# Patient Record
Sex: Female | Born: 1970 | Race: White | Hispanic: No | Marital: Single | Smoking: Never smoker
Health system: Southern US, Community
[De-identification: ages and names within clinical notes are randomized; demographics above are authoritative.]

## PROBLEM LIST (undated history)

## (undated) DIAGNOSIS — Z8041 Family history of malignant neoplasm of ovary: Secondary | ICD-10-CM

## (undated) DIAGNOSIS — F32A Depression, unspecified: Secondary | ICD-10-CM

## (undated) DIAGNOSIS — I10 Essential (primary) hypertension: Secondary | ICD-10-CM

## (undated) DIAGNOSIS — F419 Anxiety disorder, unspecified: Secondary | ICD-10-CM

## (undated) DIAGNOSIS — F329 Major depressive disorder, single episode, unspecified: Secondary | ICD-10-CM

## (undated) DIAGNOSIS — K219 Gastro-esophageal reflux disease without esophagitis: Secondary | ICD-10-CM

## (undated) DIAGNOSIS — G4700 Insomnia, unspecified: Secondary | ICD-10-CM

## (undated) HISTORY — DX: Gastro-esophageal reflux disease without esophagitis: K21.9

## (undated) HISTORY — DX: Essential (primary) hypertension: I10

## (undated) HISTORY — DX: Depression, unspecified: F32.A

## (undated) HISTORY — DX: Major depressive disorder, single episode, unspecified: F32.9

## (undated) HISTORY — DX: Anxiety disorder, unspecified: F41.9

## (undated) HISTORY — DX: Family history of malignant neoplasm of ovary: Z80.41

## (undated) HISTORY — DX: Insomnia, unspecified: G47.00

## (undated) HISTORY — PX: MEDIAL PARTIAL KNEE REPLACEMENT: SHX5965

---

## 2009-05-15 HISTORY — PX: KNEE ARTHROSCOPY: SHX127

## 2009-11-15 HISTORY — PX: MEDIAL PARTIAL KNEE REPLACEMENT: SHX5965

## 2015-05-16 HISTORY — PX: ABLATION: SHX5711

## 2015-08-16 ENCOUNTER — Encounter: Payer: Self-pay | Admitting: Family Medicine

## 2015-08-16 ENCOUNTER — Ambulatory Visit (INDEPENDENT_AMBULATORY_CARE_PROVIDER_SITE_OTHER): Payer: BLUE CROSS/BLUE SHIELD | Admitting: Family Medicine

## 2015-08-16 VITALS — BP 120/80 | HR 80 | Ht 68.0 in | Wt 299.0 lb

## 2015-08-16 DIAGNOSIS — I10 Essential (primary) hypertension: Secondary | ICD-10-CM

## 2015-08-16 DIAGNOSIS — F419 Anxiety disorder, unspecified: Secondary | ICD-10-CM | POA: Diagnosis not present

## 2015-08-16 DIAGNOSIS — Z7189 Other specified counseling: Secondary | ICD-10-CM

## 2015-08-16 DIAGNOSIS — Z9889 Other specified postprocedural states: Secondary | ICD-10-CM | POA: Diagnosis not present

## 2015-08-16 DIAGNOSIS — Z7689 Persons encountering health services in other specified circumstances: Secondary | ICD-10-CM

## 2015-08-16 MED ORDER — METOPROLOL SUCCINATE ER 100 MG PO TB24: 100.0000 mg | ORAL_TABLET | Freq: Every day | ORAL | 1 refills | Status: DC

## 2015-08-16 MED ORDER — NAPROXEN 500 MG PO TABS: 500.0000 mg | ORAL_TABLET | Freq: Two times a day (BID) | ORAL | 1 refills | Status: DC

## 2015-08-16 NOTE — Progress Notes (Signed)
Name: Laura Christian   MRN: [id number]    DOB: Sep 19, 1970   Date:08/16/2015       Progress Note  Subjective  Chief Complaint  Chief Complaint  Patient presents with  . Establish Care  . 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 Anxiety    has been taking Lorazepam for approx 3 years- has family stress and anxiety- tried to come off in the past and crying spells and anxiety got worse    Knee Pain   The incident occurred more than 1 week ago. The injury mechanism was a fall (stress fx and meniscal tear with partial knee replacement). The pain is present in the left knee. The pain is at a severity of 7/10. The pain is severe. Associated symptoms include an inability to bear weight and a loss of motion. Pertinent negatives include no tingling. The symptoms are aggravated by palpation. She has tried acetaminophen for the symptoms. The treatment provided mild relief.  Anxiety  Presents for follow-up visit. Symptoms include nervous/anxious behavior and panic. Patient reports no chest pain, depressed mood, dizziness, excessive worry, insomnia, irritability, nausea, palpitations, shortness of breath or suicidal ideas. The quality of sleep is good.      No problem-specific Assessment & Plan notes found for this encounter.   No past medical history on file.  No past surgical history on file.  No family history on file.  Social History   Social History  . Marital status: Single    Spouse name: N/A  . Number of children: N/A  . Years of education: N/A   Occupational History  . Not on file.   Social History Main Topics  . Smoking status: Never Smoker  . Smokeless tobacco: Never Used  . Alcohol use Yes  . Drug use: No  . Sexual activity: Yes   Other Topics Concern  . Not on file   Social History Narrative  . No narrative on file    Allergies  Allergen Reactions  . Depakote [Valproic Acid] Nausea  And Vomiting     Review of Systems  Constitutional: Negative for chills, fever, irritability, malaise/fatigue and weight loss.  HENT: Negative for ear discharge, ear pain and sore throat.   Eyes: Negative for blurred vision.  Respiratory: Negative for cough, sputum production, shortness of breath and wheezing.   Cardiovascular: Negative for chest pain, palpitations and leg swelling.  Gastrointestinal: Negative for abdominal pain, blood in stool, constipation, diarrhea, heartburn, melena and nausea.  Genitourinary: Negative for dysuria, frequency, hematuria and urgency.  Musculoskeletal: Negative for back pain, joint pain, myalgias and neck pain.  Skin: Negative for rash.  Neurological: Negative for dizziness, tingling, sensory change, focal weakness and headaches.  Endo/Heme/Allergies: Negative for environmental allergies and polydipsia. Does not bruise/bleed easily.  Psychiatric/Behavioral: Negative for depression and suicidal ideas. The patient is nervous/anxious. The patient does not have insomnia.      Objective  Vitals:   08/16/15 1512  BP: 120/80  Pulse: 80  Weight: 299 lb (135.6 kg)  Height:  (1.727 m)    Physical Exam  Constitutional: She is well-developed, well-nourished, and in no distress. No distress.  HENT:  Head: Normocephalic and atraumatic.  Right Ear: External ear normal.  Left Ear: External ear normal.  Nose: Nose normal.  Mouth/Throat: Oropharynx is clear and moist.  Eyes: Conjunctivae and EOM are normal. Pupils are equal, round, and reactive to light.  Right eye exhibits no discharge. Left eye exhibits no discharge.  Neck: Normal range of motion. Neck supple. No JVD present. No thyromegaly present.  Cardiovascular: Normal rate, regular rhythm, normal heart sounds and intact distal pulses.  Exam reveals no gallop and no friction rub.   No murmur heard. Pulmonary/Chest: Effort normal and breath sounds normal.  Abdominal: Soft. Bowel sounds are normal.  She exhibits no mass. There is no tenderness. There is no guarding.  Musculoskeletal: Normal range of motion. She exhibits no edema.  Lymphadenopathy:    She has no cervical adenopathy.  Neurological: She is alert.  Skin: Skin is warm and dry. She is not diaphoretic.  Psychiatric: Mood and affect normal.  Nursing note and vitals reviewed.     Assessment & Plan  Problem List Items Addressed This Visit    None    Visit Diagnoses    Encounter to establish care with new doctor    -  Primary   History of knee surgery       Relevant Medications   naproxen (NAPROSYN) 500 MG tablet   Other Relevant Orders   Ambulatory referral to Orthopedic Surgery   Chronic anxiety       Relevant Medications   LORazepam (ATIVAN) 1 MG tablet   Other Relevant Orders   Ambulatory referral to Psychiatry   Essential hypertension       Relevant Medications   metoprolol succinate (TOPROL-XL) 100 MG 24 hr tablet        Dr. Hayden [facility]  08/16/15

## 2016-01-30 ENCOUNTER — Other Ambulatory Visit: Payer: Self-pay | Admitting: Family Medicine

## 2016-01-30 DIAGNOSIS — Z9889 Other specified postprocedural states: Secondary | ICD-10-CM

## 2016-02-17 ENCOUNTER — Encounter: Payer: Self-pay | Admitting: Family Medicine

## 2016-02-17 ENCOUNTER — Ambulatory Visit (INDEPENDENT_AMBULATORY_CARE_PROVIDER_SITE_OTHER): Payer: BLUE CROSS/BLUE SHIELD | Admitting: Family Medicine

## 2016-02-17 VITALS — BP 120/88 | HR 64 | Ht 69.0 in | Wt 287.0 lb

## 2016-02-17 DIAGNOSIS — I10 Essential (primary) hypertension: Secondary | ICD-10-CM

## 2016-02-17 DIAGNOSIS — L309 Dermatitis, unspecified: Secondary | ICD-10-CM | POA: Diagnosis not present

## 2016-02-17 DIAGNOSIS — I872 Venous insufficiency (chronic) (peripheral): Secondary | ICD-10-CM

## 2016-02-17 DIAGNOSIS — M129 Arthropathy, unspecified: Secondary | ICD-10-CM | POA: Diagnosis not present

## 2016-02-17 DIAGNOSIS — Z9889 Other specified postprocedural states: Secondary | ICD-10-CM | POA: Diagnosis not present

## 2016-02-17 MED ORDER — NAPROXEN 500 MG PO TABS: 500.0000 mg | ORAL_TABLET | Freq: Two times a day (BID) | ORAL | 1 refills | Status: DC

## 2016-02-17 MED ORDER — METOPROLOL SUCCINATE ER 100 MG PO TB24: 100.0000 mg | ORAL_TABLET | Freq: Every day | ORAL | 3 refills | Status: DC

## 2016-02-17 MED ORDER — TRIAMCINOLONE ACETONIDE 0.1 % EX CREA: 1.0000 "application " | TOPICAL_CREAM | Freq: Two times a day (BID) | CUTANEOUS | 0 refills | Status: DC

## 2016-02-17 NOTE — Progress Notes (Signed)
Name: Laura Christian   MRN: [id number]    DOB: Apr 13, 1970   Date:02/17/2016       Progress Note  Subjective  Chief Complaint  Chief Complaint  Patient presents with  . Arthritis    L) knee pain  . Hypertension    Arthritis  Presents for follow-up visit. She complains of pain, stiffness and joint swelling. She reports no joint warmth. The symptoms have been worsening. Affected locations include the left knee (left knee). Her pain is at a severity of 8/10. Associated symptoms include pain at night, pain while resting and rash. Pertinent negatives include no diarrhea, dry eyes, dry mouth, dysuria, fatigue, fever, Raynaud's syndrome, uveitis or weight loss. Side effects of treatment include joint pain.  Hypertension  This is a chronic problem. The current episode started more than 1 year ago. The problem has been waxing and waning since onset. The problem is controlled. Pertinent negatives include no anxiety, blurred vision, chest pain, headaches, malaise/fatigue, neck pain, orthopnea, palpitations, peripheral edema, PND, shortness of breath or sweats. There are no associated agents to hypertension. Risk factors for coronary artery disease include obesity. Past treatments include beta blockers. The current treatment provides moderate improvement. There are no compliance problems.  There is no history of angina, kidney disease, CAD/MI, CVA, heart failure, left ventricular hypertrophy, PVD, renovascular disease or retinopathy. Identifiable causes of hypertension include a hypertension causing med. There is no history of chronic renal disease.  Rash  This is a recurrent problem. The current episode started more than 1 month ago. The problem has been waxing and waning since onset. The affected locations include the left arm and right arm. The rash is characterized by redness and itchiness. She was exposed to nothing. Associated symptoms include joint pain. Pertinent negatives include no cough, diarrhea,  fatigue, fever, shortness of breath or sore throat. Past treatments include nothing.    No problem-specific Assessment & Plan notes found for this encounter.   No past medical history on file.  No past surgical history on file.  No family history on file.  Social History   Social History  . Marital status: Single    Spouse name: N/A  . Number of children: N/A  . Years of education: N/A   Occupational History  . Not on file.   Social History Main Topics  . Smoking status: Never Smoker  . Smokeless tobacco: Never Used  . Alcohol use Yes  . Drug use: No  . Sexual activity: Yes   Other Topics Concern  . Not on file   Social History Narrative  . No narrative on file    Allergies  Allergen Reactions  . Depakote [Valproic Acid] Nausea And Vomiting     Review of Systems  Constitutional: Negative for chills, fatigue, fever, malaise/fatigue and weight loss.  HENT: Negative for ear discharge, ear pain and sore throat.   Eyes: Negative for blurred vision.  Respiratory: Negative for cough, sputum production, shortness of breath and wheezing.   Cardiovascular: Negative for chest pain, palpitations, orthopnea, leg swelling and PND.  Gastrointestinal: Negative for abdominal pain, blood in stool, constipation, diarrhea, heartburn, melena and nausea.  Genitourinary: Negative for dysuria, frequency, hematuria and urgency.  Musculoskeletal: Positive for arthritis, joint pain, joint swelling and stiffness. Negative for back pain, myalgias and neck pain.  Skin: Positive for itching and rash.  Neurological: Negative for dizziness, tingling, sensory change, focal weakness and headaches.  Endo/Heme/Allergies: Negative for environmental allergies and polydipsia. Does not bruise/bleed easily.  Psychiatric/Behavioral:  Positive for depression. Negative for suicidal ideas. The patient is not nervous/anxious and does not have insomnia.      Objective  Vitals:   02/17/16 0911  BP:  120/88  Pulse: 64  Weight: 287 lb (130.2 kg)  Height: 5\' 9"  (1.753 m)    Physical Exam  Constitutional: She is well-developed, well-nourished, and in no distress. No distress.  HENT:  Head: Normocephalic and atraumatic.  Right Ear: External ear normal.  Left Ear: External ear normal.  Nose: Nose normal.  Mouth/Throat: Oropharynx is clear and moist.  Eyes: Conjunctivae and EOM are normal. Pupils are equal, round, and reactive to light. Right eye exhibits no discharge. Left eye exhibits no discharge.  Neck: Normal range of motion. Neck supple. No JVD present. No thyromegaly present.  Cardiovascular: Normal rate, regular rhythm, normal heart sounds and intact distal pulses.  Exam reveals no gallop and no friction rub.   No murmur heard. Pulmonary/Chest: Effort normal and breath sounds normal. She has no wheezes. She has no rales.  Abdominal: Soft. Bowel sounds are normal. She exhibits no mass. There is no tenderness. There is no guarding.  Musculoskeletal: Normal range of motion. She exhibits no edema.       Right knee: Tenderness found. Medial joint line and lateral joint line tenderness noted.  Lymphadenopathy:    She has no cervical adenopathy.  Neurological: She is alert. She has normal reflexes.  Skin: Skin is warm and dry. Rash noted. Rash is macular. She is not diaphoretic. There is erythema.  Psychiatric: Mood and affect normal.  Nursing note and vitals reviewed.     Assessment & Plan  Problem List Items Addressed This Visit    None    Visit Diagnoses    Essential hypertension    -  Primary   Relevant Medications   metoprolol succinate (TOPROL-XL) 100 MG 24 hr tablet   Arthropathy       Eczema, unspecified type       Relevant Medications   triamcinolone cream (KENALOG) 0.1 %   History of knee surgery       Relevant Medications   naproxen (NAPROSYN) 500 MG tablet   Venous insufficiency       compression stockings presciption   Relevant Medications   metoprolol  succinate (TOPROL-XL) 100 MG 24 hr tablet        Dr. Hayden Rasmusseneanna Jonita Hirota [facility]  02/17/16

## 2016-10-02 ENCOUNTER — Other Ambulatory Visit: Payer: Self-pay

## 2016-10-02 ENCOUNTER — Ambulatory Visit (INDEPENDENT_AMBULATORY_CARE_PROVIDER_SITE_OTHER): Payer: BLUE CROSS/BLUE SHIELD | Admitting: Maternal Newborn

## 2016-10-02 ENCOUNTER — Encounter: Payer: Self-pay | Admitting: Maternal Newborn

## 2016-10-02 ENCOUNTER — Other Ambulatory Visit: Payer: Self-pay | Admitting: Maternal Newborn

## 2016-10-02 VITALS — BP 132/84 | HR 76 | Ht 68.0 in | Wt 286.0 lb

## 2016-10-02 DIAGNOSIS — N9489 Other specified conditions associated with female genital organs and menstrual cycle: Secondary | ICD-10-CM | POA: Insufficient documentation

## 2016-10-02 DIAGNOSIS — I10 Essential (primary) hypertension: Secondary | ICD-10-CM | POA: Insufficient documentation

## 2016-10-02 DIAGNOSIS — F419 Anxiety disorder, unspecified: Secondary | ICD-10-CM | POA: Insufficient documentation

## 2016-10-02 DIAGNOSIS — Z6841 Body Mass Index (BMI) 40.0 and over, adult: Secondary | ICD-10-CM

## 2016-10-02 DIAGNOSIS — Z3009 Encounter for other general counseling and advice on contraception: Secondary | ICD-10-CM

## 2016-10-02 DIAGNOSIS — Z124 Encounter for screening for malignant neoplasm of cervix: Secondary | ICD-10-CM | POA: Diagnosis not present

## 2016-10-02 LAB — POCT URINE PREGNANCY: Preg Test, Ur: NEGATIVE

## 2016-10-02 NOTE — Progress Notes (Signed)
Obstetrics & Gynecology Office Visit   Chief Complaint:  Chief Complaint  Patient presents with  . Menstrual Problem    lower abdominal cramping without active bleeding after ablation 05/2015    History of Present Illness: Patient reports uterine cramping that began about three weeks ago and occurs on a daily basis. She reports that it feels like period cramping or at its most severe, like labor contractions. She has tried NSAIDs (equivalent to 600 mg ibuprofen q 6h) without relief. It worsens with activity and standing for long periods. The only things that improve it are resting in bed and heat therapy, which lessen the symptoms but do not make the cramping cease completely.   Review of Systems  Constitutional: Negative.   HENT: Negative.   Eyes: Negative.   Cardiovascular: Negative.   Gastrointestinal: Negative.   Genitourinary:       Uterine cramping moderate to severe  Musculoskeletal: Positive for joint pain.  Skin: Negative.   Neurological: Negative.   Endo/Heme/Allergies: Negative.   Psychiatric/Behavioral: The patient is nervous/anxious.     Past Medical History:  Past Medical History:  Diagnosis Date  . Anxiety   . Depression   . GERD (gastroesophageal reflux disease)   . Hypertension   . Insomnia     Past Surgical History:  Past Surgical History:  Procedure Laterality Date  . ABLATION  05/2015  . MEDIAL PARTIAL KNEE REPLACEMENT Left     Gynecologic History: No LMP recorded. Patient has had an ablation.  Obstetric History: [id number]  Family History:  Family History  Problem Relation Age of Onset  . Ovarian cancer Maternal Grandmother 75  . Lung cancer Paternal Grandmother 95  . Diabetes Neg Hx   . Hypertension Neg Hx   . Stroke Neg Hx   . Thyroid disease Neg Hx     Social History:  Social History   Social History  . Marital status: Single    Spouse name: N/A  . Number of children: N/A  . Years of education: N/A   Occupational History    . Not on file.   Social History Main Topics  . Smoking status: Never Smoker  . Smokeless tobacco: Never Used  . Alcohol use Yes  . Drug use: No  . Sexual activity: Yes    Birth control/ protection: None, Other-see comments     Comment: ablation   Other Topics Concern  . Not on file   Social History Narrative  . No narrative on file    Allergies:  Allergies  Allergen Reactions  . Depakote [Valproic Acid] Nausea And Vomiting    Medications: Prior to Admission medications   Medication Sig Start Date End Date Taking? Authorizing Provider  ENGERIX-B 20 MCG/ML injection  09/25/16  Yes [provider]  FLUZONE QUADRIVALENT 0.5 ML injection  09/25/16  Yes [provider]  LORazepam (ATIVAN) 1 MG tablet Take 1 tablet by mouth daily. 07/21/15  Yes [provider]  metoprolol succinate (TOPROL-XL) 100 MG 24 hr tablet Take 1 tablet (100 mg total) by mouth daily. 02/17/16  Yes Duanne Limerick, MD  naproxen (NAPROSYN) 500 MG tablet Take 1 tablet (500 mg total) by mouth 2 (two) times daily. 02/17/16  Yes Duanne Limerick, MD  omeprazole (PRILOSEC) 40 MG capsule Take 40 mg by mouth 2 (two) times daily. otc   Yes [provider]  sertraline (ZOLOFT) 50 MG tablet Take 1 tablet by mouth daily. 11/24/15  Yes [provider]  traZODone (  DESYREL) 50 MG tablet Take 1 tablet by mouth 2 (two) times daily. 11/24/15  Yes [provider]  triamcinolone cream (KENALOG) 0.1 % Apply 1 application topically 2 (two) times daily. 02/17/16  Yes Duanne Limerick, MD    Physical Exam Vitals:  Vitals:   10/02/16 1109  BP: 132/84  Pulse: 76   No LMP recorded. Patient has had an ablation.  General: NAD HEENT: normocephalic, anicteric Pulmonary: No increased work of breathing Abdomen: obese, soft, non-tender, non-distended Genitourinary:  External: Enlarged labia minora.  Normal urethral meatus, normal Bartholin's and  Skene's glands.    Vagina: Normal vaginal  mucosa, no evidence of prolapse.    Cervix: Grossly normal in appearance, no bleeding  Uterus: Non-enlarged, mobile, normal contour.  No CMT. Tender to palpation at  midline.  Adnexa: no adnexal masses, tenderness to palpation in bilateral adnexal area  Rectal: deferred  Lymphatic: no evidence of inguinal lymphadenopathy Extremities: no edema, erythema, or tenderness Neurologic: Grossly intact Psychiatric: mood appropriate, affect full  Assessment: 46 y.o. [id number] presents for new onset of daily uterine cramping three weeks ago. She reports having a uterine ablation in May, 2017 in [place].  Plan: Problem List Items Addressed This Visit    Uterine cramping - Primary   Relevant Orders   POCT urine pregnancy   US PELVIS (TRANSABDOMINAL ONLY)    Other Visit Diagnoses    Pap smear for cervical cancer screening         Pelvic ultrasound ordered today to explore the cause of patient's ongoing symptoms and follow-up with MD planned to discuss findings and treatment options.  Patient has tried NSAIDs without significant relief since onset of symptoms.  She will continue to use them to help lessen cramping. Advised not to exceed safe usage limits.   We discussed that she is not a candidate for oral contraceptives for symptom relief or for birth control due to hypertension.  She is aware of the risks of pregnancy with uterine ablation and needs an improved contraceptive method, as she is currently using condoms. She is interested in a tubal ligation, and I advised her to discuss this at her follow-up visit with an MD.  POCT pregnancy testing was performed today to rule out ectopic pregnancy and was negative.  Patient also requested a Pap smear today as it has been more than a year since her previous exam, and she cannot recall if the last exam was normal.  Return in about 1 week (around 10/09/2016) for MD visit following pelvic US for uterine cramping s/p ablation.   Marcelyn Bruins,  CNM 10/02/2016  11:53 AM

## 2016-10-02 NOTE — Addendum Note (Signed)
Addended by: Marcelyn Bruins on: 10/02/2016 05:42 PM   Modules accepted: Orders

## 2016-10-06 LAB — PAP IG AND HPV HIGH-RISK
HPV, high-risk: NEGATIVE
PAP SMEAR COMMENT: 0

## 2016-10-11 ENCOUNTER — Ambulatory Visit: Payer: BLUE CROSS/BLUE SHIELD | Admitting: Family Medicine

## 2016-10-12 ENCOUNTER — Encounter: Payer: Self-pay | Admitting: Obstetrics & Gynecology

## 2016-10-12 ENCOUNTER — Other Ambulatory Visit: Payer: Self-pay | Admitting: Maternal Newborn

## 2016-10-12 ENCOUNTER — Ambulatory Visit (INDEPENDENT_AMBULATORY_CARE_PROVIDER_SITE_OTHER): Payer: BLUE CROSS/BLUE SHIELD

## 2016-10-12 ENCOUNTER — Ambulatory Visit (INDEPENDENT_AMBULATORY_CARE_PROVIDER_SITE_OTHER): Payer: BLUE CROSS/BLUE SHIELD | Admitting: Obstetrics & Gynecology

## 2016-10-12 VITALS — BP 130/80 | HR 71 | Ht 67.0 in | Wt 286.0 lb

## 2016-10-12 DIAGNOSIS — N9489 Other specified conditions associated with female genital organs and menstrual cycle: Secondary | ICD-10-CM | POA: Diagnosis not present

## 2016-10-12 NOTE — Patient Instructions (Signed)
Total Laparoscopic Hysterectomy °A total laparoscopic hysterectomy is a minimally invasive surgery to remove your uterus and cervix. This surgery is performed by making several small cuts (incisions) in your abdomen. It can also be done with a thin, lighted tube (laparoscope) inserted into two small incisions in your lower abdomen. Your fallopian tubes and ovaries can be removed (bilateral salpingo-oophorectomy) during this surgery as well. Benefits of minimally invasive surgery include: °· Less pain. °· Less risk of blood loss. °· Less risk of infection. °· Quicker return to normal activities. ° °Tell a health care provider about: °· Any allergies you have. °· All medicines you are taking, including vitamins, herbs, eye drops, creams, and over-the-counter medicines. °· Any problems you or family members have had with anesthetic medicines. °· Any blood disorders you have. °· Any surgeries you have had. °· Any medical conditions you have. °What are the risks? °Generally, this is a safe procedure. However, as with any procedure, complications can occur. Possible complications include: °· Bleeding. °· Blood clots in the legs or lung. °· Infection. °· Injury to surrounding organs. °· Problems with anesthesia. °· Early menopause symptoms (hot flashes, night sweats, insomnia). °· Risk of conversion to an open abdominal incision. ° °What happens before the procedure? °· Ask your health care provider about changing or stopping your regular medicines. °· Do not take aspirin or blood thinners (anticoagulants) for 1 week before the surgery or as told by your health care provider. °· Do not eat or drink anything for 8 hours before the surgery or as told by your health care provider. °· Quit smoking if you smoke. °· Arrange for a ride home after surgery and for someone to help you at home during recovery. °What happens during the procedure? °· You will be given antibiotic medicine. °· An IV tube will be placed in your arm. You  will be given medicine to make you sleep (general anesthetic). °· A gas (carbon dioxide) will be used to inflate your abdomen. This will allow your surgeon to look inside your abdomen, perform your surgery, and treat any other problems found if necessary. °· Three or four small incisions (often less than 1/2 inch) will be made in your abdomen. One of these incisions will be made in the area of your belly button (navel). The laparoscope will be inserted into the incision. Your surgeon will look through the laparoscope while doing your procedure. °· Other surgical instruments will be inserted through the other incisions. °· Your uterus may be removed through your vagina or cut into small pieces and removed through the small incisions. °· Your incisions will be closed. °What happens after the procedure? °· The gas will be released from inside your abdomen. °· You will be taken to the recovery area where a nurse will watch and check your progress. Once you are awake, stable, and taking fluids well, without other problems, you will return to your room or be allowed to go home. °· There is usually minimal discomfort following the surgery because the incisions are so small. °· You will be given pain medicine while you are in the hospital and for when you go home. °This information is not intended to replace advice given to you by your health care provider. Make sure you discuss any questions you have with your health care provider. °Document Released: 10/29/2006 Document Revised: 06/09/2015 Document Reviewed: 07/22/2012 °[employer] Interactive Patient Education © 2017 [employer] ° °

## 2016-10-12 NOTE — Progress Notes (Signed)
HPI: Abdominal Pain Patient presents for evaluation of abdominal pain. The pain is described as sharp, shooting and stabbing, and is 8/10 in intensity. Pain is located in the suprapubic, deep pelvis and UTERUS area without radiation. Onset was gradual occurring several months ago. Symptoms have been gradually worsening since. Aggravating factors: none. Alleviating factors: none. Associated symptoms: none. The patient denies chills and fever. Risk factors for pelvic/abdominal pain include none.    Ablation May 2017, no bleeding since.  These sx's began several mos ago and have been 2-3 weeks per month of pain for 3-4  Mos now.  Ultrasound demonstrates no masses seen, no cysts.  Lining thin These findings are Pelvis normal  Review of ULTRASOUND.    I have personally reviewed images and report of recent ultrasound done at [facility].    Plan of management to be discussed with patient.  PMHx: She  has a past medical history of Anxiety; Depression; GERD (gastroesophageal reflux disease); Hypertension; and Insomnia. Also,  has a past surgical history that includes Medial partial knee replacement (Left) and Ablation (05/2015)., family history includes Lung cancer (age of onset: 38) in her paternal grandmother; Ovarian cancer (age of onset: 62) in her maternal grandmother.,  reports that she has never smoked. She has never used smokeless tobacco. She reports that she drinks alcohol. She reports that she does not use drugs.  She has a current medication list which includes the following prescription(s): engerix-b, fluzone quadrivalent, lorazepam, metoprolol succinate, naproxen, omeprazole, sertraline, trazodone, and triamcinolone cream. Also, is allergic to depakote [valproic acid].  Review of Systems  All other systems reviewed and are negative.   Objective: BP 130/80   Pulse 71   Ht  (1.702 m)   Wt 286 lb (129.7 kg)   BMI 44.79 kg/m   Physical examination Constitutional NAD, Conversant    Skin No rashes, lesions or ulceration.   Extremities: Moves all appropriately.  Normal ROM for age. No lymphadenopathy.  Neuro: Grossly intact  Psych: Oriented to PPT.  Normal mood. Normal affect.   Assessment:  Uterine cramping S/p ablation (so no bleeding, but severe pain monthly/cyclic) Options of waiting for menopause (for cyclic sx's to abate) vs hysterectomy (to remove cramping painful organ) d/w pt.  Hormones not likely to help and no bleeding to control.  To consider.  I have had a careful discussion with this patient about all the options available and the risk/benefits of each. I have fully informed this patient that a hysterectomy may subject her to a variety of discomforts and risks: She understands that most patients have surgery with little difficulty, but problems can happen ranging from minor to fatal. These include nausea, vomiting, pain, bleeding, infection, poor healing, hernia, or formation of adhesions. Unexpected reactions may occur from any drug or anesthetic given. Unintended injury may occur to other pelvic or abdominal structures such as Fallopian tubes, ovaries, bladder, ureter (tube from kidney to bladder), or bowel. Nerves going from the pelvis to the legs may be injured. Any such injury may require immediate or later additional surgery to correct the problem. Excessive blood loss requiring transfusion is very unlikely but possible. Dangerous blood clots may form in the legs or lungs. Physical and sexual activity will be restricted in varying degrees for an indeterminate period of time but most often 2-4 weeks. She understands that the plan is to do this laparoscopically, however, there is a chance that this will need to be performed via a larger incision. She will most likely  be hospitalized overnight. Finally, she understands that it is impossible to list every possible undesirable effect and that the condition for which surgery is done is not always cured or significantly  improved, and in rare cases may be even worse. I have also counseled her extensively about the pros and cons of ovarian conservation versus removal. Ample time was given to answer all questions.  A total of 15 minutes were spent face-to-face with the patient during this encounter and over half of that time dealt with counseling and coordination of care.   Laura Major, MD, [facility], [facility] 10/12/2016  4:25 PM

## 2016-10-23 ENCOUNTER — Encounter: Payer: Self-pay | Admitting: Obstetrics and Gynecology

## 2016-10-25 ENCOUNTER — Other Ambulatory Visit: Payer: Self-pay

## 2016-12-09 ENCOUNTER — Other Ambulatory Visit: Payer: Self-pay | Admitting: Family Medicine

## 2016-12-09 DIAGNOSIS — Z9889 Other specified postprocedural states: Secondary | ICD-10-CM

## 2017-01-17 ENCOUNTER — Other Ambulatory Visit: Payer: Self-pay | Admitting: Family Medicine

## 2017-01-17 DIAGNOSIS — Z9889 Other specified postprocedural states: Secondary | ICD-10-CM

## 2017-01-25 ENCOUNTER — Ambulatory Visit (INDEPENDENT_AMBULATORY_CARE_PROVIDER_SITE_OTHER): Payer: BLUE CROSS/BLUE SHIELD | Admitting: Family Medicine

## 2017-01-25 ENCOUNTER — Encounter: Payer: Self-pay | Admitting: Family Medicine

## 2017-01-25 DIAGNOSIS — Z9889 Other specified postprocedural states: Secondary | ICD-10-CM

## 2017-01-25 DIAGNOSIS — I10 Essential (primary) hypertension: Secondary | ICD-10-CM

## 2017-01-25 MED ORDER — METOPROLOL SUCCINATE ER 100 MG PO TB24: 100.0000 mg | ORAL_TABLET | Freq: Every day | ORAL | 3 refills | Status: DC

## 2017-01-25 MED ORDER — NAPROXEN 500 MG PO TABS: 500.0000 mg | ORAL_TABLET | Freq: Two times a day (BID) | ORAL | 3 refills | Status: DC

## 2017-01-25 NOTE — Progress Notes (Signed)
Name: Laura Christian   MRN: [id number]    DOB: 06-09-1970   Date:01/25/2017       Progress Note  Subjective  Chief Complaint  Chief Complaint  Patient presents with  . Hypertension  . Arthritis    knee pain- needs refill on Naproxen    Hypertension  This is a chronic problem. The current episode started more than 1 year ago. The problem has been gradually improving since onset. The problem is controlled. Pertinent negatives include no anxiety, blurred vision, chest pain, headaches, malaise/fatigue, neck pain, orthopnea, palpitations, peripheral edema, PND, shortness of breath or sweats. There are no associated agents to hypertension.  Arthritis  Pertinent negatives include no diarrhea, dysuria, fever, rash or weight loss.    No problem-specific Assessment & Plan notes found for this encounter.   Past Medical History:  Diagnosis Date  . Anxiety   . Depression   . Family history of ovarian cancer    10/18 cancer genetic testing letter sent  . GERD (gastroesophageal reflux disease)   . Hypertension   . Insomnia     Past Surgical History:  Procedure Laterality Date  . ABLATION  05/2015  . MEDIAL PARTIAL KNEE REPLACEMENT Left     Family History  Problem Relation Age of Onset  . Ovarian cancer Maternal Grandmother 49  . Lung cancer Paternal Grandmother 60  . Diabetes Neg Hx   . Hypertension Neg Hx   . Stroke Neg Hx   . Thyroid disease Neg Hx     Social History   Socioeconomic History  . Marital status: Single    Spouse name: Not on file  . Number of children: Not on file  . Years of education: Not on file  . Highest education level: Not on file  Social Needs  . Financial resource strain: Not on file  . Food insecurity - worry: Not on file  . Food insecurity - inability: Not on file  . Transportation needs - medical: Not on file  . Transportation needs - non-medical: Not on file  Occupational History  . Not on file  Tobacco Use  . Smoking status: Never  Smoker  . Smokeless tobacco: Never Used  Substance and Sexual Activity  . Alcohol use: Yes  . Drug use: No  . Sexual activity: Yes    Birth control/protection: None, Other-see comments    Comment: ablation  Other Topics Concern  . Not on file  Social History Narrative  . Not on file    Allergies  Allergen Reactions  . Depakote [Valproic Acid] Nausea And Vomiting    Outpatient Medications Prior to Visit  Medication Sig Dispense Refill  . LORazepam (ATIVAN) 1 MG tablet Take 1 tablet by mouth daily.    Marland Kitchen omeprazole (PRILOSEC) 40 MG capsule Take 40 mg by mouth 2 (two) times daily. otc    . sertraline (ZOLOFT) 50 MG tablet Take 1 tablet by mouth daily.    . traZODone (DESYREL) 50 MG tablet Take 1 tablet by mouth 2 (two) times daily.    Marland Kitchen triamcinolone cream (KENALOG) 0.1 % Apply 1 application topically 2 (two) times daily. 30 g 0  . metoprolol succinate (TOPROL-XL) 100 MG 24 hr tablet Take 1 tablet (100 mg total) by mouth daily. 90 tablet 3  . naproxen (NAPROSYN) 500 MG tablet TAKE ONE TABLET BY MOUTH TWICE DAILY 60 tablet 0  . ENGERIX-B 20 MCG/ML injection     . FLUZONE QUADRIVALENT 0.5 ML injection      No  facility-administered medications prior to visit.     Review of Systems  Constitutional: Negative for chills, fever, malaise/fatigue and weight loss.  HENT: Negative for ear discharge, ear pain and sore throat.   Eyes: Negative for blurred vision.  Respiratory: Negative for cough, sputum production, shortness of breath and wheezing.   Cardiovascular: Negative for chest pain, palpitations, orthopnea, leg swelling and PND.  Gastrointestinal: Negative for abdominal pain, blood in stool, constipation, diarrhea, heartburn, melena and nausea.  Genitourinary: Negative for dysuria, frequency, hematuria and urgency.  Musculoskeletal: Positive for arthritis. Negative for back pain, joint pain, myalgias and neck pain.  Skin: Negative for rash.  Neurological: Negative for dizziness,  tingling, sensory change, focal weakness and headaches.  Endo/Heme/Allergies: Negative for environmental allergies and polydipsia. Does not bruise/bleed easily.  Psychiatric/Behavioral: Negative for depression and suicidal ideas. The patient is not nervous/anxious and does not have insomnia.      Objective  Vitals:   01/25/17 1054  BP: 130/80  Pulse: 78  Weight: 294 lb (133.4 kg)  Height: 5\' 7"  (1.702 m)    Physical Exam  Constitutional: She is well-developed, well-nourished, and in no distress. No distress.  HENT:  Head: Normocephalic and atraumatic.  Right Ear: External ear normal.  Left Ear: External ear normal.  Nose: Nose normal.  Mouth/Throat: Oropharynx is clear and moist.  Eyes: Conjunctivae and EOM are normal. Pupils are equal, round, and reactive to light. Right eye exhibits no discharge. Left eye exhibits no discharge.  Neck: Normal range of motion. Neck supple. No JVD present. No thyromegaly present.  Cardiovascular: Normal rate, regular rhythm, normal heart sounds and intact distal pulses. Exam reveals no gallop and no friction rub.  No murmur heard. Pulmonary/Chest: Effort normal and breath sounds normal. She has no wheezes. She has no rales.  Abdominal: Soft. Bowel sounds are normal. She exhibits no mass. There is no tenderness. There is no guarding.  Musculoskeletal: Normal range of motion. She exhibits no edema.  Lymphadenopathy:    She has no cervical adenopathy.  Neurological: She is alert. She has normal reflexes.  Skin: Skin is warm and dry. She is not diaphoretic.  Psychiatric: Mood and affect normal.  Nursing note and vitals reviewed.     Assessment & Plan  Problem List Items Addressed This Visit      Cardiovascular and Mediastinum   Hypertension   Relevant Medications   metoprolol succinate (TOPROL-XL) 100 MG 24 hr tablet   Other Relevant Orders   Renal function panel    Other Visit Diagnoses    History of knee surgery       Relevant  Medications   naproxen (NAPROSYN) 500 MG tablet      Meds ordered this encounter  Medications  . metoprolol succinate (TOPROL-XL) 100 MG 24 hr tablet    Sig: Take 1 tablet (100 mg total) by mouth daily.    Dispense:  90 tablet    Refill:  3  . naproxen (NAPROSYN) 500 MG tablet    Sig: Take 1 tablet (500 mg total) by mouth 2 (two) times daily.    Dispense:  180 tablet    Refill:  3    sched appt for meds      Dr. Elizabeth Sauereanna Joshual Terrio [facility]  01/25/17

## 2017-01-26 LAB — RENAL FUNCTION PANEL
Albumin: 4.3 g/dL (ref 3.5–5.5)
BUN / CREAT RATIO: 17 (ref 9–23)
BUN: 11 mg/dL (ref 6–24)
CHLORIDE: 102 mmol/L (ref 96–106)
CO2: 25 mmol/L (ref 20–29)
Calcium: 9.3 mg/dL (ref 8.7–10.2)
Creatinine, Ser: 0.64 mg/dL (ref 0.57–1.00)
GFR calc Af Amer: 124 mL/min/{1.73_m2} (ref >59)
GFR calc non Af Amer: 107 mL/min/{1.73_m2} (ref >59)
Glucose: 73 mg/dL (ref 65–99)
POTASSIUM: 4.6 mmol/L (ref 3.5–5.2)
Phosphorus: 3.6 mg/dL (ref 2.5–4.5)
SODIUM: 140 mmol/L (ref 134–144)

## 2017-03-11 ENCOUNTER — Other Ambulatory Visit: Payer: Self-pay

## 2017-03-11 ENCOUNTER — Ambulatory Visit
Admission: EM | Admit: 2017-03-11 | Discharge: 2017-03-11 | Disposition: A | Discharge status: Home / Self Care | Payer: BLUE CROSS/BLUE SHIELD | Attending: Family Medicine | Admitting: Family Medicine

## 2017-03-11 DIAGNOSIS — M791 Myalgia, unspecified site: Secondary | ICD-10-CM | POA: Diagnosis not present

## 2017-03-11 DIAGNOSIS — R509 Fever, unspecified: Secondary | ICD-10-CM | POA: Diagnosis not present

## 2017-03-11 DIAGNOSIS — R5383 Other fatigue: Secondary | ICD-10-CM | POA: Diagnosis not present

## 2017-03-11 DIAGNOSIS — J111 Influenza due to unidentified influenza virus with other respiratory manifestations: Secondary | ICD-10-CM

## 2017-03-11 DIAGNOSIS — R05 Cough: Secondary | ICD-10-CM | POA: Diagnosis not present

## 2017-03-11 DIAGNOSIS — R69 Illness, unspecified: Secondary | ICD-10-CM | POA: Diagnosis not present

## 2017-03-11 MED ORDER — OSELTAMIVIR PHOSPHATE 75 MG PO CAPS: 75.0000 mg | ORAL_CAPSULE | Freq: Two times a day (BID) | ORAL | 0 refills | Status: DC

## 2017-03-11 NOTE — ED Provider Notes (Signed)
[facility]    CSN: [id number] Arrival date & time: 03/11/17  1247     History   Chief Complaint Chief Complaint  Patient presents with  . Generalized Body Aches    HPI Laura Christian is a 47 y.o. female.   The history is provided by the patient.  URI  Presenting symptoms: cough, fatigue and fever   Severity:  Moderate Onset quality:  Sudden Duration:  2 days Timing:  Constant Progression:  Worsening Chronicity:  New Relieved by:  Nothing Ineffective treatments:  OTC medications Associated symptoms: myalgias   Associated symptoms: no wheezing   Risk factors: sick contacts   Risk factors: not elderly, no chronic cardiac disease, no chronic kidney disease, no chronic respiratory disease, no diabetes mellitus, no immunosuppression, no recent illness and no recent travel     Past Medical History:  Diagnosis Date  . Anxiety   . Depression   . Family history of ovarian cancer    10/18 cancer genetic testing letter sent  . GERD (gastroesophageal reflux disease)   . Hypertension   . Insomnia     Patient Active Problem List   Diagnosis Date Noted  . Uterine cramping 10/02/2016  . Chronic anxiety 10/02/2016  . Hypertension 10/02/2016  . Morbid obesity with BMI of 40.0-44.9, adult (HCC) 10/02/2016    Past Surgical History:  Procedure Laterality Date  . ABLATION  05/2015  . MEDIAL PARTIAL KNEE REPLACEMENT Left     OB History    Gravida Para Term Preterm AB Living   4 4 4     4    SAB TAB Ectopic Multiple Live Births           4       Home Medications    Prior to Admission medications   Medication Sig Start Date End Date Taking? Authorizing Provider  LORazepam (ATIVAN) 1 MG tablet Take 1 tablet by mouth daily. 07/21/15  Yes [provider]  metoprolol succinate (TOPROL-XL) 100 MG 24 hr tablet Take 1 tablet (100 mg total) by mouth daily. 01/25/17  Yes Duanne Limerick, MD  naproxen (NAPROSYN) 500 MG tablet Take 1 tablet (500 mg total) by  mouth 2 (two) times daily. 01/25/17  Yes Duanne Limerick, MD  omeprazole (PRILOSEC) 40 MG capsule Take 40 mg by mouth 2 (two) times daily. otc   Yes [provider]  sertraline (ZOLOFT) 50 MG tablet Take 1 tablet by mouth daily. 11/24/15  Yes [provider]  traZODone (DESYREL) 50 MG tablet Take 1 tablet by mouth 2 (two) times daily. 11/24/15  Yes [provider]  triamcinolone cream (KENALOG) 0.1 % Apply 1 application topically 2 (two) times daily. 02/17/16  Yes Duanne Limerick, MD  oseltamivir (TAMIFLU) 75 MG capsule Take 1 capsule (75 mg total) by mouth 2 (two) times daily. 03/11/17   Payton Mccallum, MD    Family History Family History  Problem Relation Age of Onset  . Ovarian cancer Maternal Grandmother 63  . Lung cancer Paternal Grandmother 45  . Diabetes Neg Hx   . Hypertension Neg Hx   . Stroke Neg Hx   . Thyroid disease Neg Hx     Social History Social History   Tobacco Use  . Smoking status: Never Smoker  . Smokeless tobacco: Never Used  Substance Use Topics  . Alcohol use: Yes    Comment: occasionally  . Drug use: No     Allergies   Depakote [valproic acid]   Review  of Systems Review of Systems  Constitutional: Positive for fatigue and fever.  Respiratory: Positive for cough. Negative for wheezing.   Musculoskeletal: Positive for myalgias.     Physical Exam Triage Vital Signs ED Triage Vitals  Enc Vitals Group     BP 03/11/17 1306 118/73     Pulse Rate 03/11/17 1306 92     Resp 03/11/17 1306 18     Temp 03/11/17 1306 99.3 F (37.4 C)     Temp Source 03/11/17 1306 Oral     SpO2 03/11/17 1306 99 %     Weight 03/11/17 1305 280 lb (127 kg)     Height 03/11/17 1305 5\' 8"  (1.727 m)     Head Circumference --      Peak Flow --      Pain Score 03/11/17 1304 10     Pain Loc --      Pain Edu? --      Excl. in GC? --    No data found.  Updated Vital Signs BP 118/73 (BP Location: Right Arm)   Pulse 92   Temp 99.3 F (37.4 C)  (Oral)   Resp 18   Ht 5\' 8"  (1.727 m)   Wt 280 lb (127 kg)   SpO2 99%   BMI 42.57 kg/m   Visual Acuity Right Eye Distance:   Left Eye Distance:   Bilateral Distance:    Right Eye Near:   Left Eye Near:    Bilateral Near:     Physical Exam  Constitutional: She appears well-developed and well-nourished. No distress.  HENT:  Head: Normocephalic and atraumatic.  Right Ear: Tympanic membrane, external ear and ear canal normal.  Left Ear: Tympanic membrane, external ear and ear canal normal.  Nose: Mucosal edema and rhinorrhea present. No nose lacerations, sinus tenderness, nasal deformity, septal deviation or nasal septal hematoma. No epistaxis.  No foreign bodies. Right sinus exhibits no maxillary sinus tenderness and no frontal sinus tenderness. Left sinus exhibits no maxillary sinus tenderness and no frontal sinus tenderness.  Mouth/Throat: Uvula is midline, oropharynx is clear and moist and mucous membranes are normal. No oropharyngeal exudate.  Eyes: Conjunctivae and EOM are normal. Pupils are equal, round, and reactive to light. Right eye exhibits no discharge. Left eye exhibits no discharge. No scleral icterus.  Neck: Normal range of motion. Neck supple. No thyromegaly present.  Cardiovascular: Normal rate, regular rhythm and normal heart sounds.  Pulmonary/Chest: Effort normal and breath sounds normal. No respiratory distress. She has no wheezes. She has no rales.  Lymphadenopathy:    She has no cervical adenopathy.  Skin: She is not diaphoretic.  Nursing note and vitals reviewed.    UC Treatments / Results  Labs (all labs ordered are listed, but only abnormal results are displayed) Labs Reviewed - No data to display  EKG  EKG Interpretation None       Radiology No results found.  Procedures Procedures (including critical care time)  Medications Ordered in UC Medications - No data to display   Initial Impression / Assessment and Plan / UC Course  I have  reviewed the triage vital signs and the nursing notes.  Pertinent labs & imaging results that were available during my care of the patient were reviewed by me and considered in my medical decision making (see chart for details).       Final Clinical Impressions(s) / UC Diagnoses   Final diagnoses:  Influenza-like illness    ED Discharge Orders  Ordered    oseltamivir (TAMIFLU) 75 MG capsule  2 times daily     03/11/17 1341     1.diagnosis reviewed with patient 2. rx as per orders above; reviewed possible side effects, interactions, risks and benefits  3. Recommend supportive treatment with rest, fluids, otc analgesics prn 4. Follow-up prn if symptoms worsen or don't improve  Controlled Substance Prescriptions [place] Controlled Substance Registry consulted? Not Applicable   Payton Mccallum, MD 03/11/17 1351

## 2017-03-11 NOTE — ED Triage Notes (Signed)
Patient complains of body aches that started on Friday. Patient states that she is moving and chalked everything up to moving. Patient reports that last night she started having chills and fever.

## 2017-03-14 ENCOUNTER — Telehealth: Payer: Self-pay | Admitting: Emergency Medicine

## 2017-03-14 NOTE — Telephone Encounter (Signed)
Called to follow up after patient's recent visit. Patient states she is feeling much better. 

## 2017-03-31 ENCOUNTER — Other Ambulatory Visit: Payer: Self-pay

## 2017-03-31 ENCOUNTER — Emergency Department: Payer: BLUE CROSS/BLUE SHIELD

## 2017-03-31 ENCOUNTER — Encounter: Payer: Self-pay | Admitting: Emergency Medicine

## 2017-03-31 DIAGNOSIS — M5431 Sciatica, right side: Secondary | ICD-10-CM | POA: Diagnosis not present

## 2017-03-31 DIAGNOSIS — R509 Fever, unspecified: Secondary | ICD-10-CM | POA: Diagnosis not present

## 2017-03-31 DIAGNOSIS — N390 Urinary tract infection, site not specified: Secondary | ICD-10-CM | POA: Insufficient documentation

## 2017-03-31 DIAGNOSIS — Z96652 Presence of left artificial knee joint: Secondary | ICD-10-CM | POA: Diagnosis not present

## 2017-03-31 DIAGNOSIS — I10 Essential (primary) hypertension: Secondary | ICD-10-CM | POA: Insufficient documentation

## 2017-03-31 DIAGNOSIS — Z79899 Other long term (current) drug therapy: Secondary | ICD-10-CM | POA: Insufficient documentation

## 2017-03-31 LAB — INFLUENZA PANEL BY PCR (TYPE A & B)
INFLAPCR: NEGATIVE
INFLBPCR: NEGATIVE

## 2017-03-31 MED ORDER — ACETAMINOPHEN 500 MG PO TABS
1000.0000 mg | ORAL_TABLET | Freq: Once | ORAL | Status: AC
  Administered 2017-03-31: 1000 mg via ORAL

## 2017-03-31 MED ORDER — ACETAMINOPHEN 500 MG PO TABS
ORAL_TABLET | ORAL | Status: AC
  Filled 2017-03-31: qty 2

## 2017-03-31 NOTE — ED Triage Notes (Signed)
Pt to triage via wheelchair. Pt reports about an hour ago at work she developed body aches, fever and chills. Pt also reports she has hx of sciatica on the right side and is having pain in that area.Pt took bayer back and body at 4 pm and two ibuprofen around 12noon.

## 2017-04-01 ENCOUNTER — Emergency Department
Admission: EM | Admit: 2017-04-01 | Discharge: 2017-04-01 | Disposition: A | Discharge status: Home / Self Care | Payer: BLUE CROSS/BLUE SHIELD | Attending: Emergency Medicine | Admitting: Emergency Medicine

## 2017-04-01 DIAGNOSIS — M5431 Sciatica, right side: Secondary | ICD-10-CM

## 2017-04-01 DIAGNOSIS — N390 Urinary tract infection, site not specified: Secondary | ICD-10-CM

## 2017-04-01 DIAGNOSIS — R509 Fever, unspecified: Secondary | ICD-10-CM

## 2017-04-01 LAB — BLOOD CULTURE ID PANEL (REFLEXED)
Acinetobacter baumannii: NOT DETECTED
CANDIDA GLABRATA: NOT DETECTED
CANDIDA TROPICALIS: NOT DETECTED
Candida albicans: NOT DETECTED
Candida krusei: NOT DETECTED
Candida parapsilosis: NOT DETECTED
Carbapenem resistance: NOT DETECTED
ESCHERICHIA COLI: DETECTED — AB
Enterobacter cloacae complex: NOT DETECTED
Enterobacteriaceae species: DETECTED — AB
Enterococcus species: NOT DETECTED
Haemophilus influenzae: NOT DETECTED
KLEBSIELLA OXYTOCA: NOT DETECTED
Klebsiella pneumoniae: NOT DETECTED
Listeria monocytogenes: NOT DETECTED
Neisseria meningitidis: NOT DETECTED
PROTEUS SPECIES: NOT DETECTED
Pseudomonas aeruginosa: NOT DETECTED
SERRATIA MARCESCENS: NOT DETECTED
STAPHYLOCOCCUS SPECIES: NOT DETECTED
Staphylococcus aureus (BCID): NOT DETECTED
Streptococcus agalactiae: NOT DETECTED
Streptococcus pneumoniae: NOT DETECTED
Streptococcus pyogenes: NOT DETECTED
Streptococcus species: NOT DETECTED

## 2017-04-01 LAB — URINALYSIS, COMPLETE (UACMP) WITH MICROSCOPIC
BILIRUBIN URINE: NEGATIVE
Glucose, UA: NEGATIVE mg/dL
HGB URINE DIPSTICK: NEGATIVE
Ketones, ur: NEGATIVE mg/dL
Nitrite: POSITIVE — AB
PH: 5 (ref 5.0–8.0)
Protein, ur: NEGATIVE mg/dL
SPECIFIC GRAVITY, URINE: 1.026 (ref 1.005–1.030)

## 2017-04-01 LAB — COMPREHENSIVE METABOLIC PANEL
ALBUMIN: 3.8 g/dL (ref 3.5–5.0)
ALT: 18 U/L (ref 14–54)
ANION GAP: 8 (ref 5–15)
AST: 26 U/L (ref 15–41)
Alkaline Phosphatase: 73 U/L (ref 38–126)
BILIRUBIN TOTAL: 0.9 mg/dL (ref 0.3–1.2)
BUN: 21 mg/dL — ABNORMAL HIGH (ref 6–20)
CHLORIDE: 106 mmol/L (ref 101–111)
CO2: 26 mmol/L (ref 22–32)
Calcium: 8.9 mg/dL (ref 8.9–10.3)
Creatinine, Ser: 0.85 mg/dL (ref 0.44–1.00)
GFR calc Af Amer: >60 mL/min (ref >60)
GFR calc non Af Amer: >60 mL/min (ref >60)
GLUCOSE: 108 mg/dL — AB (ref 65–99)
POTASSIUM: 4 mmol/L (ref 3.5–5.1)
SODIUM: 140 mmol/L (ref 135–145)
TOTAL PROTEIN: 7.3 g/dL (ref 6.5–8.1)

## 2017-04-01 LAB — CBC WITH DIFFERENTIAL/PLATELET
BASOS ABS: 0 10*3/uL (ref 0–0.1)
BASOS PCT: 1 %
EOS ABS: 0.1 10*3/uL (ref 0–0.7)
EOS PCT: 1 %
HEMATOCRIT: 33.4 % — AB (ref 35.0–47.0)
Hemoglobin: 10.9 g/dL — ABNORMAL LOW (ref 12.0–16.0)
Lymphocytes Relative: 8 %
Lymphs Abs: 0.7 10*3/uL — ABNORMAL LOW (ref 1.0–3.6)
MCH: 26.9 pg (ref 26.0–34.0)
MCHC: 32.5 g/dL (ref 32.0–36.0)
MCV: 82.7 fL (ref 80.0–100.0)
MONO ABS: 0.3 10*3/uL (ref 0.2–0.9)
MONOS PCT: 4 %
Neutro Abs: 6.8 10*3/uL — ABNORMAL HIGH (ref 1.4–6.5)
Neutrophils Relative %: 86 %
PLATELETS: 246 10*3/uL (ref 150–440)
RBC: 4.04 MIL/uL (ref 3.80–5.20)
RDW: 16.3 % — AB (ref 11.5–14.5)
WBC: 7.9 10*3/uL (ref 3.6–11.0)

## 2017-04-01 LAB — TROPONIN I: Troponin I: <0.03 ng/mL (ref <0.03)

## 2017-04-01 LAB — LACTIC ACID, PLASMA: LACTIC ACID, VENOUS: 0.9 mmol/L (ref 0.5–1.9)

## 2017-04-01 MED ORDER — KETOROLAC TROMETHAMINE 30 MG/ML IJ SOLN
10.0000 mg | Freq: Once | INTRAMUSCULAR | Status: AC
  Administered 2017-04-01: 9.9 mg via INTRAVENOUS
  Filled 2017-04-01: qty 1

## 2017-04-01 MED ORDER — DIAZEPAM 2 MG PO TABS: 2.0000 mg | ORAL_TABLET | Freq: Three times a day (TID) | ORAL | 0 refills | Status: DC | PRN

## 2017-04-01 MED ORDER — ONDANSETRON 4 MG PO TBDP: 4.0000 mg | ORAL_TABLET | Freq: Three times a day (TID) | ORAL | 0 refills | Status: DC | PRN

## 2017-04-01 MED ORDER — SODIUM CHLORIDE 0.9 % IV BOLUS (SEPSIS)
1000.0000 mL | Freq: Once | INTRAVENOUS | Status: AC
  Administered 2017-04-01: 1000 mL via INTRAVENOUS

## 2017-04-01 MED ORDER — SODIUM CHLORIDE 0.9 % IV SOLN
1.0000 g | Freq: Once | INTRAVENOUS | Status: AC
  Administered 2017-04-01: 1 g via INTRAVENOUS
  Filled 2017-04-01: qty 10

## 2017-04-01 MED ORDER — CEPHALEXIN 500 MG PO CAPS: 500.0000 mg | ORAL_CAPSULE | Freq: Three times a day (TID) | ORAL | 0 refills | Status: DC

## 2017-04-01 MED ORDER — DIAZEPAM 2 MG PO TABS
2.0000 mg | ORAL_TABLET | Freq: Once | ORAL | Status: AC
  Administered 2017-04-01: 2 mg via ORAL
  Filled 2017-04-01: qty 1

## 2017-04-01 MED ORDER — IBUPROFEN 800 MG PO TABS: 800.0000 mg | ORAL_TABLET | Freq: Three times a day (TID) | ORAL | 0 refills | Status: DC | PRN

## 2017-04-01 NOTE — Discharge Instructions (Signed)
1.  Take antibiotic as prescribed (Keflex 500 mg 3 times daily for 7 days). 2.  Alternate Tylenol and ibuprofen every 4 hours as needed for fever greater than 100.4 F. 3.  You may take medicines as needed for pain and muscle spasms (Motrin/Valium #15). 4.  Apply moist heat to affected area several times daily. 5.  Return to the ER for worsening symptoms, persistent vomiting, difficulty breathing or other concerns.

## 2017-04-01 NOTE — ED Notes (Signed)
Resting with eyes closed.  Patient up dated.

## 2017-04-01 NOTE — ED Provider Notes (Signed)
[facility] Emergency Department Provider Note   ____________________________________________   First MD Initiated Contact with Patient 04/01/17 [phone]     (approximate)  I have reviewed the triage vital signs and the nursing notes.   HISTORY  Chief Complaint Fever and Sciatica    HPI Laura Christian is a 47 y.o. female who presents to the ED from home with a chief complaint of fever, chills, body aches and right-sided sciatica.  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  She is seeing a Landchiropractor for this.  States she was at work when she developed sudden onset of chills, body aches, fever and generalized malaise.  Denies ear pain, sore throat, neck pain, cough, congestion, abdominal pain, nausea, vomiting, diarrhea.  Did experience some generalized chest pain without diaphoresis or dizziness.  Denies recent travel or trauma.   Past Medical History:  Diagnosis Date  . Anxiety   . Depression   . Family history of ovarian cancer    10/18 cancer genetic testing letter sent  . GERD (gastroesophageal reflux disease)   . Hypertension   . Insomnia     Patient Active Problem List   Diagnosis Date Noted  . Uterine cramping 10/02/2016  . Chronic anxiety 10/02/2016  . Hypertension 10/02/2016  . Morbid obesity with BMI of 40.0-44.9, adult (HCC) 10/02/2016    Past Surgical History:  Procedure Laterality Date  . ABLATION  05/2015  . MEDIAL PARTIAL KNEE REPLACEMENT Left     Prior to Admission medications   Medication Sig Start Date End Date Taking? Authorizing Provider  cephALEXin (KEFLEX) 500 MG capsule Take 1 capsule (500 mg total) by mouth 3 (three) times daily. 04/01/17   Irean HongSung, Abella Shugart J, MD  diazepam (VALIUM) 2 MG tablet Take 1 tablet (2 mg total) by mouth every 8 (eight) hours as needed for muscle spasms. 04/01/17   Irean HongSung, Anjoli Diemer J, MD  ibuprofen (ADVIL,MOTRIN) 800 MG tablet Take 1 tablet (800 mg total) by  mouth every 8 (eight) hours as needed for moderate pain. 04/01/17   Irean HongSung, Malissia Rabbani J, MD  LORazepam (ATIVAN) 1 MG tablet Take 1 tablet by mouth daily. 07/21/15   [provider]  metoprolol succinate (TOPROL-XL) 100 MG 24 hr tablet Take 1 tablet (100 mg total) by mouth daily. 01/25/17   Duanne LimerickJones, Deanna C, MD  naproxen (NAPROSYN) 500 MG tablet Take 1 tablet (500 mg total) by mouth 2 (two) times daily. 01/25/17   Duanne LimerickJones, Deanna C, MD  omeprazole (PRILOSEC) 40 MG capsule Take 40 mg by mouth 2 (two) times daily. otc    [provider]  ondansetron (ZOFRAN ODT) 4 MG disintegrating tablet Take 1 tablet (4 mg total) by mouth every 8 (eight) hours as needed for nausea or vomiting. 04/01/17   Irean HongSung, Rodd Heft J, MD  oseltamivir (TAMIFLU) 75 MG capsule Take 1 capsule (75 mg total) by mouth 2 (two) times daily. 03/11/17   Payton Mccallumonty, [place], MD  sertraline (ZOLOFT) 50 MG tablet Take 1 tablet by mouth daily. 11/24/15   [provider]  traZODone (DESYREL) 50 MG tablet Take 1 tablet by mouth 2 (two) times daily. 11/24/15   [provider]  triamcinolone cream (KENALOG) 0.1 % Apply 1 application topically 2 (two) times daily. 02/17/16   Duanne LimerickJones, Deanna C, MD    Allergies Depakote [valproic acid]  Family History  Problem Relation Age of Onset  . Ovarian cancer Maternal Grandmother 4465  . Lung cancer Paternal Grandmother  65  . Diabetes Neg Hx   . Hypertension Neg Hx   . Stroke Neg Hx   . Thyroid disease Neg Hx     Social History Social History   Tobacco Use  . Smoking status: Never Smoker  . Smokeless tobacco: Never Used  Substance Use Topics  . Alcohol use: Yes    Comment: occasionally  . Drug use: No    Review of Systems  Constitutional: Positive for fever/chills, myalgias, generalized malaise. Eyes: No visual changes. ENT: No sore throat. Cardiovascular: Positive for chest pain. Respiratory: Denies shortness of breath. Gastrointestinal: No abdominal pain.  No nausea, no vomiting.   No diarrhea.  No constipation. Genitourinary: Negative for dysuria. Musculoskeletal: Positive for back pain. Skin: Negative for rash. Neurological: Negative for headaches, focal weakness or numbness.   ____________________________________________   PHYSICAL EXAM:  VITAL SIGNS: ED Triage Vitals  Enc Vitals Group     BP 03/31/17 2141 (!) 131/101     Pulse Rate 03/31/17 2141 (!) 123     Resp 03/31/17 2141 18     Temp 03/31/17 2141 (!) 100.5 F (38.1 C)     Temp Source 03/31/17 2141 Oral     SpO2 03/31/17 2141 99 %     Weight 03/31/17 2142 280 lb (127 kg)     Height 03/31/17 2142 5\' 8"  (1.727 m)     Head Circumference --      Peak Flow --      Pain Score 03/31/17 2142 8     Pain Loc --      Pain Edu? --      Excl. in GC? --     Constitutional: Alert and oriented. Well appearing and in no acute distress. Eyes: Conjunctivae are normal. PERRL. EOMI. Head: Atraumatic. Nose: No congestion/rhinnorhea. Mouth/Throat: Mucous membranes are moist.  Oropharynx non-erythematous. Neck: No stridor.  No cervical spine tenderness to palpation.  Supple neck without meningismus. Cardiovascular: Normal rate, regular rhythm. Grossly normal heart sounds.  Good peripheral circulation. Respiratory: Normal respiratory effort.  No retractions. Lungs CTAB. Gastrointestinal: Soft and nontender to light or deep palpation. No distention. No abdominal bruits. No CVA tenderness. Musculoskeletal: No spinal tenderness to palpation. Right buttock ttp. Right SLR at 45 degrees. 2+ femoral and distal pulses. No lower extremity tenderness nor edema.  No joint effusions. Neurologic:  Normal speech and language. No gross focal neurologic deficits are appreciated.  Skin:  Skin is warm, dry and intact. No rash noted. Psychiatric: Mood and affect are normal. Speech and behavior are normal.  ____________________________________________   LABS (all labs ordered are listed, but only abnormal results are  displayed)  Labs Reviewed  CBC WITH DIFFERENTIAL/PLATELET - Abnormal; Notable for the following components:      Result Value   Hemoglobin 10.9 (*)    HCT 33.4 (*)    RDW 16.3 (*)    Neutro Abs 6.8 (*)    Lymphs Abs 0.7 (*)    All other components within normal limits  COMPREHENSIVE METABOLIC PANEL - Abnormal; Notable for the following components:   Glucose, Bld 108 (*)    BUN 21 (*)    All other components within normal limits  URINALYSIS, COMPLETE (UACMP) WITH MICROSCOPIC - Abnormal; Notable for the following components:   Color, Urine YELLOW (*)    APPearance HAZY (*)    Nitrite POSITIVE (*)    Leukocytes, UA MODERATE (*)    Bacteria, UA MANY (*)    Squamous Epithelial / LPF 6-30 (*)  All other components within normal limits  CULTURE, BLOOD (ROUTINE X 2)  CULTURE, BLOOD (ROUTINE X 2)  URINE CULTURE  INFLUENZA PANEL BY PCR (TYPE A & B)  TROPONIN I  LACTIC ACID, PLASMA  LACTIC ACID, PLASMA   ____________________________________________  EKG  ED ECG REPORT I, Irena Gaydos J, the attending physician, personally viewed and interpreted this ECG.   Date: 04/01/2017  EKG Time: 0517  Rate: 80  Rhythm: normal EKG, normal sinus rhythm  Axis: Normal  Intervals:nonspecific intraventricular conduction delay  ST&T Change: Nonspecific  ____________________________________________  RADIOLOGY  ED MD interpretation: No pneumonia  Official radiology report(s): Dg Chest 2 View  Result Date: 03/31/2017 CLINICAL DATA:  Body ache, fever and chills today. EXAM: CHEST - 2 VIEW COMPARISON:  None. FINDINGS: The heart size and mediastinal contours are within normal limits. Pneumonic consolidation or CHF. No effusion or pneumothorax. The visualized skeletal structures are unremarkable. IMPRESSION: No active cardiopulmonary disease. Electronically Signed   By: Tollie Eth M.D.   On: 03/31/2017 22:32    ____________________________________________   PROCEDURES  Procedure(s)  performed: None  Procedures  Critical Care performed: No  ____________________________________________   INITIAL IMPRESSION / ASSESSMENT AND PLAN / ED COURSE  As part of my medical decision making, I reviewed the following data within the electronic MEDICAL RECORD NUMBER Nursing notes reviewed and incorporated, Labs reviewed, EKG interpreted, Old chart reviewed, Radiograph reviewed and Notes from prior ED visits   47 year old female who presents with flulike symptoms and exacerbation of her chronic right-sided sciatica.  No focal neurological deficits.  No extremity weakness, numbness/tingling, bowel or bladder incontinence.  Influenza and chest x-ray are negative.  Patient's multiple medical complaints, will obtain lab work, urinalysis; administer NSAIDs, muscle relaxer and reassess.  Clinical Course as of Apr 01 708  Mon Apr 01, 2017  1610 Patient resting in no acute distress.  Currently afebrile.  IV Rocephin completed.  Awaiting results of lactic acid.  [JS]  0658 Lactate noted to be unremarkable.  Will discharge home on Keflex for UTI, NSAIDs and muscle relaxer for sciatica.  Patient will follow-up with her PCP as well as orthopedics.  Preliminary blood cultures noted to be negative. Urine cultures pending.  Strict return precautions given.  Patient verbalizes understanding and agrees with plan of care.  [JS]    Clinical Course User Index [JS] Irean Hong, MD     ____________________________________________   FINAL CLINICAL IMPRESSION(S) / ED DIAGNOSES  Final diagnoses:  Febrile illness  Urinary tract infection without hematuria, site unspecified  Sciatica of right side     ED Discharge Orders        Ordered    cephALEXin (KEFLEX) 500 MG capsule  3 times daily     04/01/17 0655    ibuprofen (ADVIL,MOTRIN) 800 MG tablet  Every 8 hours PRN     04/01/17 0655    diazepam (VALIUM) 2 MG tablet  Every 8 hours PRN     04/01/17 0655    ondansetron (ZOFRAN ODT) 4 MG  disintegrating tablet  Every 8 hours PRN     04/01/17 0655       Note:  This document was prepared using Dragon voice recognition software and may include unintentional dictation errors.    Irean Hong, MD 04/01/17 [phone]

## 2017-04-03 ENCOUNTER — Telehealth: Payer: Self-pay | Admitting: Family Medicine

## 2017-04-03 ENCOUNTER — Telehealth: Payer: Self-pay

## 2017-04-03 NOTE — Telephone Encounter (Signed)
Laura Christian called about follow up appt from the hospital having some issues with pain in her kidneys.

## 2017-04-03 NOTE — Telephone Encounter (Signed)
Laura Christian called to want to be seen with Dr Yetta BarreJones after 3days from the hospital for UTI complaining of exhaustion, can't sleep at night.

## 2017-04-03 NOTE — Telephone Encounter (Signed)
TOC #1. Called pt to f/u after [facility]. Also wanted to confirm her hosp f/u appt w/ Dr. Yetta BarreJones on 04/11/17 @ 9:30am. Discharge planning includes the following:  - f/u with Dr. Joice LoftsPoggi 1 week for R sided sciatica - Start Keflex for UTI - Take ibuprofen, Valium for sciatica pain - Take Zofran as needed - f/u PCP as above per Dr. Yetta BarreJones recommendation [facility] I am also wanting to discuss/review the above information with the pt to ensure all of the above have been taken care of. LVM requesting returned call.

## 2017-04-04 ENCOUNTER — Encounter: Payer: Self-pay | Admitting: *Deleted

## 2017-04-04 ENCOUNTER — Ambulatory Visit
Admission: EM | Admit: 2017-04-04 | Discharge: 2017-04-04 | Disposition: A | Discharge status: Home / Self Care | Payer: BLUE CROSS/BLUE SHIELD | Attending: Family Medicine | Admitting: Family Medicine

## 2017-04-04 DIAGNOSIS — N12 Tubulo-interstitial nephritis, not specified as acute or chronic: Secondary | ICD-10-CM | POA: Diagnosis not present

## 2017-04-04 LAB — URINALYSIS, COMPLETE (UACMP) WITH MICROSCOPIC
Bilirubin Urine: NEGATIVE
Glucose, UA: NEGATIVE mg/dL
Ketones, ur: NEGATIVE mg/dL
Nitrite: NEGATIVE
Protein, ur: NEGATIVE mg/dL
pH: 6 (ref 5.0–8.0)

## 2017-04-04 LAB — CULTURE, BLOOD (ROUTINE X 2)

## 2017-04-04 LAB — URINE CULTURE

## 2017-04-04 MED ORDER — HYDROCODONE-ACETAMINOPHEN 5-325 MG PO TABS: 1.0000 | ORAL_TABLET | Freq: Three times a day (TID) | ORAL | 0 refills | Status: DC | PRN

## 2017-04-04 MED ORDER — CEFDINIR 300 MG PO CAPS: 300.0000 mg | ORAL_CAPSULE | Freq: Two times a day (BID) | ORAL | 0 refills | Status: DC

## 2017-04-04 NOTE — ED Provider Notes (Signed)
[facility]   CSN: [id number] Arrival date & time: 04/04/17  1402  History   Chief Complaint Chief Complaint  Patient presents with  . Urinary Tract Infection   HPI  47 year old female presents with flank pain.  She recently seen in the ER on 3/18. She was found to have pyelonephritis.  Patient was discharged home on Keflex after receiving IV Rocephin.  Patient's blood cultures returned positive for E. coli.  Urine culture positive for E. coli.  Pansensitive.  Patient states that her fever and chills have subsided.  She continues to be fatigued and have a decreased appetite.  She continues to have severe right flank pain.  She endorses compliance with the antibiotic.  No known exacerbating relieving factors.  No other associated symptoms.  No other complaints.  Past Medical History:  Diagnosis Date  . Anxiety   . Depression   . Family history of ovarian cancer    10/18 cancer genetic testing letter sent  . GERD (gastroesophageal reflux disease)   . Hypertension   . Insomnia    Patient Active Problem List   Diagnosis Date Noted  . Uterine cramping 10/02/2016  . Chronic anxiety 10/02/2016  . Hypertension 10/02/2016  . Morbid obesity with BMI of 40.0-44.9, adult (HCC) 10/02/2016   Past Surgical History:  Procedure Laterality Date  . ABLATION  05/2015  . MEDIAL PARTIAL KNEE REPLACEMENT Left    OB History    Gravida  4   Para  4   Term  4   Preterm      AB      Living  4     SAB      TAB      Ectopic      Multiple      Live Births  4          Home Medications    Prior to Admission medications   Medication Sig Start Date End Date Taking? Authorizing Provider  cefdinir (OMNICEF) 300 MG capsule Take 1 capsule (300 mg total) by mouth 2 (two) times daily. 04/04/17   Tommie Sams, DO  diazepam (VALIUM) 2 MG tablet Take 1 tablet (2 mg total) by mouth every 8 (eight) hours as needed for muscle spasms. 04/01/17   Irean Hong, MD    HYDROcodone-acetaminophen (NORCO/VICODIN) 5-325 MG tablet Take 1 tablet by mouth every 8 (eight) hours as needed. 04/04/17   Tommie Sams, DO  ibuprofen (ADVIL,MOTRIN) 800 MG tablet Take 1 tablet (800 mg total) by mouth every 8 (eight) hours as needed for moderate pain. 04/01/17   Irean Hong, MD  LORazepam (ATIVAN) 1 MG tablet Take 1 tablet by mouth daily. 07/21/15   [provider]  metoprolol succinate (TOPROL-XL) 100 MG 24 hr tablet Take 1 tablet (100 mg total) by mouth daily. 01/25/17   Duanne Limerick, MD  naproxen (NAPROSYN) 500 MG tablet Take 1 tablet (500 mg total) by mouth 2 (two) times daily. 01/25/17   Duanne Limerick, MD  omeprazole (PRILOSEC) 40 MG capsule Take 40 mg by mouth 2 (two) times daily. otc    [provider]  ondansetron (ZOFRAN ODT) 4 MG disintegrating tablet Take 1 tablet (4 mg total) by mouth every 8 (eight) hours as needed for nausea or vomiting. 04/01/17   Irean Hong, MD  oseltamivir (TAMIFLU) 75 MG capsule Take 1 capsule (75 mg total) by mouth 2 (two) times daily. 03/11/17   Payton Mccallum, MD  sertraline (ZOLOFT)  50 MG tablet Take 1 tablet by mouth daily. 11/24/15   [provider]  traZODone (DESYREL) 50 MG tablet Take 1 tablet by mouth 2 (two) times daily. 11/24/15   [provider]  triamcinolone cream (KENALOG) 0.1 % Apply 1 application topically 2 (two) times daily. 02/17/16   Duanne Limerick, MD    Family History Family History  Problem Relation Age of Onset  . Ovarian cancer Maternal Grandmother 40  . Lung cancer Paternal Grandmother 22  . Diabetes Neg Hx   . Hypertension Neg Hx   . Stroke Neg Hx   . Thyroid disease Neg Hx     Social History Social History   Tobacco Use  . Smoking status: Never Smoker  . Smokeless tobacco: Never Used  Substance Use Topics  . Alcohol use: Yes    Comment: occasionally  . Drug use: No     Allergies   Depakote [valproic acid]   Review of Systems Review of Systems   Constitutional: Positive for appetite change. Negative for fever.  Genitourinary: Positive for flank pain.   Physical Exam Triage Vital Signs ED Triage Vitals  Enc Vitals Group     BP 04/04/17 1417 137/82     Pulse --      Resp 04/04/17 1417 18     Temp 04/04/17 1417 98 F (36.7 C)     Temp Source 04/04/17 1417 Oral     SpO2 04/04/17 1417 100 %     Weight 04/04/17 1415 280 lb (127 kg)     Height 04/04/17 1415 5\' 8"  (1.727 m)     Head Circumference --      Peak Flow --      Pain Score 04/04/17 1414 8     Pain Loc --      Pain Edu? --      Excl. in GC? --    Updated Vital Signs BP 137/82 (BP Location: Left Arm)   Temp 98 F (36.7 C) (Oral)   Resp 18   Ht 5\' 8"  (1.727 m)   Wt 280 lb (127 kg)   SpO2 100%   BMI 42.57 kg/m   Physical Exam  Constitutional: She appears well-developed. No distress.  Cardiovascular: Normal rate and regular rhythm.  Pulmonary/Chest: Effort normal and breath sounds normal. She has no wheezes. She has no rales.  Abdominal: Soft. She exhibits no distension. There is no tenderness.  + CVA tenderness on the right.  Psychiatric: She has a normal mood and affect. Her behavior is normal.  Nursing note and vitals reviewed.  UC Treatments / Results  Labs (all labs ordered are listed, but only abnormal results are displayed) Labs Reviewed  URINALYSIS, COMPLETE (UACMP) WITH MICROSCOPIC - Abnormal; Notable for the following components:      Result Value   APPearance CLOUDY (*)    Specific Gravity, Urine <1.005 (*)    Hgb urine dipstick TRACE (*)    Leukocytes, UA SMALL (*)    Squamous Epithelial / LPF 6-30 (*)    Bacteria, UA FEW (*)    All other components within normal limits    EKG  EKG Interpretation None       Radiology No results found.  Procedures Procedures (including critical care time)  Medications Ordered in UC Medications - No data to display   Initial Impression / Assessment and Plan / UC Course  I have reviewed the  triage vital signs and the nursing notes.  Pertinent labs & imaging results that  were available during my care of the patient were reviewed by me and considered in my medical decision making (see chart for details).     47 year old female presents with pyelonephritis.  I am [facility] her antibiotic [facility].  Given Vicodin for pain.  She is currently afebrile and does not appear septic.  Final Clinical Impressions(s) / UC Diagnoses   Final diagnoses:  Pyelonephritis    ED Discharge Orders        Ordered    cefdinir (OMNICEF) 300 MG capsule  2 times daily     04/04/17 1448    HYDROcodone-acetaminophen (NORCO/VICODIN) 5-325 MG tablet  Every 8 hours PRN     04/04/17 1448     Controlled Substance Prescriptions [place] Controlled Substance Registry consulted? Yes, I have consulted the [place] Controlled Substances Registry for this patient, and feel the risk/benefit ratio today is favorable for proceeding with this prescription for a controlled substance.   Tommie SamsCook, Keontay Vora G, [place] 04/04/17 1530

## 2017-04-04 NOTE — ED Triage Notes (Signed)
Pt was diagnose with UTI and Kidney infection at the ER this past Sunday. Pt has been taking antibiotics, muscle relaxer, but has not gotten any better. Pt states the only symptoms she had before being diagnose was pain to the right kidney, which she still has (8/10)

## 2017-04-04 NOTE — Discharge Instructions (Signed)
If you worsen, go to the hospital.  Stop Keflex. Start Omnicef.  Take care  Dr. Adriana Simasook

## 2017-04-06 LAB — CULTURE, BLOOD (ROUTINE X 2)
CULTURE: NO GROWTH
SPECIAL REQUESTS: ADEQUATE

## 2017-04-11 ENCOUNTER — Ambulatory Visit
Admission: RE | Admit: 2017-04-11 | Discharge: 2017-04-11 | Disposition: A | Discharge status: Home / Self Care | Payer: BLUE CROSS/BLUE SHIELD | Source: Ambulatory Visit | Attending: Family Medicine | Admitting: Family Medicine

## 2017-04-11 ENCOUNTER — Encounter: Payer: Self-pay | Admitting: Family Medicine

## 2017-04-11 ENCOUNTER — Ambulatory Visit (INDEPENDENT_AMBULATORY_CARE_PROVIDER_SITE_OTHER): Payer: BLUE CROSS/BLUE SHIELD | Admitting: Family Medicine

## 2017-04-11 VITALS — BP 140/98 | HR 80 | Ht 68.0 in | Wt 296.0 lb

## 2017-04-11 DIAGNOSIS — N12 Tubulo-interstitial nephritis, not specified as acute or chronic: Secondary | ICD-10-CM | POA: Diagnosis not present

## 2017-04-11 DIAGNOSIS — N2889 Other specified disorders of kidney and ureter: Secondary | ICD-10-CM | POA: Diagnosis not present

## 2017-04-11 DIAGNOSIS — I10 Essential (primary) hypertension: Secondary | ICD-10-CM | POA: Diagnosis not present

## 2017-04-11 DIAGNOSIS — Z6841 Body Mass Index (BMI) 40.0 and over, adult: Secondary | ICD-10-CM | POA: Diagnosis not present

## 2017-04-11 DIAGNOSIS — M5442 Lumbago with sciatica, left side: Secondary | ICD-10-CM

## 2017-04-11 DIAGNOSIS — M47896 Other spondylosis, lumbar region: Secondary | ICD-10-CM | POA: Diagnosis not present

## 2017-04-11 LAB — POCT URINALYSIS DIPSTICK
BILIRUBIN UA: NEGATIVE
Blood, UA: NEGATIVE
GLUCOSE UA: NEGATIVE
KETONES UA: NEGATIVE
Leukocytes, UA: NEGATIVE
Nitrite, UA: NEGATIVE
PH UA: 6 (ref 5.0–8.0)
Protein, UA: NEGATIVE
Spec Grav, UA: 1.015 (ref 1.010–1.025)
UROBILINOGEN UA: 0.2 U/dL

## 2017-04-11 MED ORDER — MELOXICAM 15 MG PO TABS: 15.0000 mg | ORAL_TABLET | Freq: Every day | ORAL | 0 refills | Status: DC

## 2017-04-11 MED ORDER — LISINOPRIL 5 MG PO TABS: 5.0000 mg | ORAL_TABLET | Freq: Every day | ORAL | 1 refills | Status: DC

## 2017-04-11 MED ORDER — CYCLOBENZAPRINE HCL 10 MG PO TABS: 10.0000 mg | ORAL_TABLET | Freq: Three times a day (TID) | ORAL | 0 refills | Status: DC | PRN

## 2017-04-11 NOTE — Progress Notes (Signed)
Name: Laura Christian   MRN: [id number]    DOB: 09/12/1970   Date:04/11/2017       Progress Note  Subjective  Chief Complaint  Chief Complaint  Patient presents with  . Follow-up    seen in ED on 04/04/2017- was switched to [facility] for pyelonephritis/ recheck urine today    Urinary Tract Infection   This is a new (followup) problem. The current episode started in the past 7 days. The problem occurs intermittently. The problem has been gradually improving. The quality of the pain is described as aching (sciatica). The pain is at a severity of 5/10. The pain is moderate. There has been no fever. She is not sexually active. There is a history of pyelonephritis. Associated symptoms include flank pain. Pertinent negatives include no chills, discharge, frequency, hematuria, hesitancy, nausea, sweats, urgency or vomiting. Associated symptoms comments: Pain resolved. Treatments tried: omnicef. The treatment provided moderate relief.  Back Pain  This is a new problem. The current episode started 1 to 4 weeks ago (6 weeks). The problem occurs intermittently. The problem has been waxing and waning since onset. The pain is present in the gluteal. The quality of the pain is described as aching. The pain radiates to the right thigh and right knee (right calf). The pain is at a severity of 6/10. The pain is moderate. The pain is the same all the time. The symptoms are aggravated by bending, sitting, twisting and lying down. Pertinent negatives include no abdominal pain, bladder incontinence, bowel incontinence, chest pain, dysuria, fever, headaches, leg pain, numbness, paresis, paresthesias, pelvic pain, perianal numbness, tingling, weakness or weight loss. She has tried chiropractic manipulation for the symptoms. The treatment provided moderate relief.  Hypertension  This is a new problem. The current episode started 1 to 4 weeks ago. The problem has been waxing and waning since onset. The problem is  uncontrolled. Pertinent negatives include no anxiety, blurred vision, chest pain, headaches, malaise/fatigue, neck pain, orthopnea, palpitations, peripheral edema, PND, shortness of breath or sweats. There are no associated agents to hypertension. Risk factors for coronary artery disease include obesity.    No problem-specific Assessment & Plan notes found for this encounter.   Past Medical History:  Diagnosis Date  . Anxiety   . Depression   . Family history of ovarian cancer    10/18 cancer genetic testing letter sent  . GERD (gastroesophageal reflux disease)   . Hypertension   . Insomnia     Past Surgical History:  Procedure Laterality Date  . ABLATION  05/2015  . MEDIAL PARTIAL KNEE REPLACEMENT Left     Family History  Problem Relation Age of Onset  . Ovarian cancer Maternal Grandmother 61  . Lung cancer Paternal Grandmother 70  . Diabetes Neg Hx   . Hypertension Neg Hx   . Stroke Neg Hx   . Thyroid disease Neg Hx     Social History   Socioeconomic History  . Marital status: Single    Spouse name: Not on file  . Number of children: Not on file  . Years of education: Not on file  . Highest education level: Not on file  Occupational History  . Not on file  Social Needs  . Financial resource strain: Not on file  . Food insecurity:    Worry: Not on file    Inability: Not on file  . Transportation needs:    Medical: Not on file    Non-medical: Not on file  Tobacco Use  .  Smoking status: Never Smoker  . Smokeless tobacco: Never Used  Substance and Sexual Activity  . Alcohol use: Yes    Comment: occasionally  . Drug use: No  . Sexual activity: Yes    Birth control/protection: None, Other-see comments    Comment: ablation  Lifestyle  . Physical activity:    Days per week: Not on file    Minutes per session: Not on file  . Stress: Not on file  Relationships  . Social connections:    Talks on phone: Not on file    Gets together: Not on file    Attends  religious service: Not on file    Active member of club or organization: Not on file    Attends meetings of clubs or organizations: Not on file    Relationship status: Not on file  . Intimate partner violence:    Fear of current or ex partner: Not on file    Emotionally abused: Not on file    Physically abused: Not on file    Forced sexual activity: Not on file  Other Topics Concern  . Not on file  Social History Narrative  . Not on file    Allergies  Allergen Reactions  . Depakote [Valproic Acid] Nausea And Vomiting    Outpatient Medications Prior to Visit  Medication Sig Dispense Refill  . cefdinir (OMNICEF) 300 MG capsule Take 1 capsule (300 mg total) by mouth 2 (two) times daily. 20 capsule 0  . HYDROcodone-acetaminophen (NORCO/VICODIN) 5-325 MG tablet Take 1 tablet by mouth every 8 (eight) hours as needed. 15 tablet 0  . LORazepam (ATIVAN) 1 MG tablet Take 1 tablet by mouth daily.    . metoprolol succinate (TOPROL-XL) 100 MG 24 hr tablet Take 1 tablet (100 mg total) by mouth daily. 90 tablet 3  . naproxen (NAPROSYN) 500 MG tablet Take 1 tablet (500 mg total) by mouth 2 (two) times daily. 180 tablet 3  . omeprazole (PRILOSEC) 40 MG capsule Take 40 mg by mouth 2 (two) times daily. otc    . sertraline (ZOLOFT) 50 MG tablet Take 1 tablet by mouth daily.    . traZODone (DESYREL) 50 MG tablet Take 1 tablet by mouth 2 (two) times daily.    Marland Kitchen. ibuprofen (ADVIL,MOTRIN) 800 MG tablet Take 1 tablet (800 mg total) by mouth every 8 (eight) hours as needed for moderate pain. (Patient not taking: Reported on 04/11/2017) 15 tablet 0  . ondansetron (ZOFRAN ODT) 4 MG disintegrating tablet Take 1 tablet (4 mg total) by mouth every 8 (eight) hours as needed for nausea or vomiting. (Patient not taking: Reported on 04/11/2017) 15 tablet 0  . triamcinolone cream (KENALOG) 0.1 % Apply 1 application topically 2 (two) times daily. (Patient not taking: Reported on 04/11/2017) 30 g 0  . diazepam (VALIUM) 2 MG  tablet Take 1 tablet (2 mg total) by mouth every 8 (eight) hours as needed for muscle spasms. 15 tablet 0  . oseltamivir (TAMIFLU) 75 MG capsule Take 1 capsule (75 mg total) by mouth 2 (two) times daily. 10 capsule 0   No facility-administered medications prior to visit.     Review of Systems  Constitutional: Negative for chills, fever, malaise/fatigue and weight loss.  HENT: Negative for ear discharge, ear pain and sore throat.   Eyes: Negative for blurred vision.  Respiratory: Negative for cough, sputum production, shortness of breath and wheezing.   Cardiovascular: Negative for chest pain, palpitations, orthopnea, leg swelling and PND.  Gastrointestinal: Negative for  abdominal pain, blood in stool, bowel incontinence, constipation, diarrhea, heartburn, melena, nausea and vomiting.  Genitourinary: Positive for flank pain. Negative for bladder incontinence, dysuria, frequency, hematuria, hesitancy, pelvic pain and urgency.  Musculoskeletal: Positive for back pain. Negative for joint pain, myalgias and neck pain.  Skin: Negative for rash.  Neurological: Negative for dizziness, tingling, sensory change, focal weakness, weakness, numbness, headaches and paresthesias.  Endo/Heme/Allergies: Negative for environmental allergies and polydipsia. Does not bruise/bleed easily.  Psychiatric/Behavioral: Negative for depression and suicidal ideas. The patient is not nervous/anxious and does not have insomnia.      Objective  Vitals:   04/11/17 1001  BP: (!) 140/98  Pulse: 80  Weight: 296 lb (134.3 kg)  Height: 5\' 8"  (1.727 m)    Physical Exam  Constitutional: She is well-developed, well-nourished, and in no distress. No distress.  HENT:  Head: Normocephalic and atraumatic.  Right Ear: External ear normal.  Left Ear: External ear normal.  Nose: Nose normal.  Mouth/Throat: Oropharynx is clear and moist.  Eyes: Pupils are equal, round, and reactive to light. Conjunctivae and EOM are normal.  Right eye exhibits no discharge. Left eye exhibits no discharge.  Neck: Normal range of motion. Neck supple. No JVD present. No thyromegaly present.  Cardiovascular: Normal rate, regular rhythm, normal heart sounds and intact distal pulses. Exam reveals no gallop and no friction rub.  No murmur heard. Pulmonary/Chest: Effort normal and breath sounds normal. She has no wheezes. She has no rales.  Abdominal: Soft. Bowel sounds are normal. She exhibits no mass. There is no tenderness. There is no guarding.  Musculoskeletal: Normal range of motion. She exhibits no edema.  Lymphadenopathy:    She has no cervical adenopathy.  Neurological: She is alert. She has normal reflexes.  Skin: Skin is warm and dry. She is not diaphoretic.  Psychiatric: Mood and affect normal.  Nursing note and vitals reviewed.     Assessment & Plan  Problem List Items Addressed This Visit      Cardiovascular and Mediastinum   Hypertension   Relevant Medications   lisinopril (PRINIVIL,ZESTRIL) 5 MG tablet   Other Relevant Orders   POCT urinalysis dipstick (Completed)     Other   Morbid obesity with BMI of 40.0-44.9, adult (HCC)    Other Visit Diagnoses    Pyelonephritis    -  Primary   Relevant Orders   POCT urinalysis dipstick (Completed)   Acute left-sided low back pain with left-sided sciatica       NEW   Relevant Medications   meloxicam (MOBIC) 15 MG tablet   cyclobenzaprine (FLEXERIL) 10 MG tablet   Other Relevant Orders   DG Lumbar Spine 2-3 Views   POCT urinalysis dipstick (Completed)      Meds ordered this encounter  Medications  . lisinopril (PRINIVIL,ZESTRIL) 5 MG tablet    Sig: Take 1 tablet (5 mg total) by mouth daily.    Dispense:  30 tablet    Refill:  1  . meloxicam (MOBIC) 15 MG tablet    Sig: Take 1 tablet (15 mg total) by mouth daily.    Dispense:  30 tablet    Refill:  0  . cyclobenzaprine (FLEXERIL) 10 MG tablet    Sig: Take 1 tablet (10 mg total) by mouth 3 (three) times  daily as needed for muscle spasms.    Dispense:  30 tablet    Refill:  0      Dr. Hayden [facility]  04/11/17   

## 2017-04-19 ENCOUNTER — Ambulatory Visit (INDEPENDENT_AMBULATORY_CARE_PROVIDER_SITE_OTHER): Payer: BLUE CROSS/BLUE SHIELD | Admitting: Family Medicine

## 2017-04-19 ENCOUNTER — Encounter: Payer: Self-pay | Admitting: Family Medicine

## 2017-04-19 VITALS — BP 134/80 | HR 72 | Ht 68.0 in | Wt 295.0 lb

## 2017-04-19 DIAGNOSIS — M5116 Intervertebral disc disorders with radiculopathy, lumbar region: Secondary | ICD-10-CM

## 2017-04-19 DIAGNOSIS — M5442 Lumbago with sciatica, left side: Secondary | ICD-10-CM | POA: Diagnosis not present

## 2017-04-19 MED ORDER — CYCLOBENZAPRINE HCL 10 MG PO TABS: 10.0000 mg | ORAL_TABLET | Freq: Three times a day (TID) | ORAL | 1 refills | Status: DC | PRN

## 2017-04-19 MED ORDER — MELOXICAM 15 MG PO TABS: 15.0000 mg | ORAL_TABLET | Freq: Every day | ORAL | 1 refills | Status: DC

## 2017-04-19 NOTE — Progress Notes (Signed)
Name: Laura Christian   MRN: [id number]    DOB: January 01, 1971   Date:04/19/2017       Progress Note  Subjective  Chief Complaint  Chief Complaint  Patient presents with  . sciatic pain    Back Pain  This is a new problem. The current episode started 1 to 4 weeks ago. The problem occurs constantly. The problem has been gradually worsening since onset. The pain is present in the lumbar spine. The quality of the pain is described as shooting, burning and aching. The pain radiates to the right foot, right knee, right thigh and left thigh ("just my left buttock). The pain is severe. The symptoms are aggravated by bending, twisting, sitting and standing (liftinf >10 lbs). Associated symptoms include leg pain, paresis and paresthesias. Pertinent negatives include no abdominal pain, bladder incontinence, bowel incontinence, chest pain, dysuria, fever, headaches, numbness, pelvic pain, perianal numbness, tingling or weight loss. She has tried NSAIDs and muscle relaxant for the symptoms. The treatment provided no relief.    No problem-specific Assessment & Plan notes found for this encounter.   Past Medical History:  Diagnosis Date  . Anxiety   . Depression   . Family history of ovarian cancer    10/18 cancer genetic testing letter sent  . GERD (gastroesophageal reflux disease)   . Hypertension   . Insomnia     Past Surgical History:  Procedure Laterality Date  . ABLATION  05/2015  . MEDIAL PARTIAL KNEE REPLACEMENT Left     Family History  Problem Relation Age of Onset  . Ovarian cancer Maternal Grandmother [id number]  . Lung cancer Paternal Grandmother 5065  . Diabetes Neg Hx   . Hypertension Neg Hx   . Stroke Neg Hx   . Thyroid disease Neg Hx     Social History   Socioeconomic History  . Marital status: Single    Spouse name: Not on file  . Number of children: Not on file  . Years of education: Not on file  . Highest education level: Not on file  Occupational History  . Not on file   Social Needs  . Financial resource strain: Not on file  . Food insecurity:    Worry: Not on file    Inability: Not on file  . Transportation needs:    Medical: Not on file    Non-medical: Not on file  Tobacco Use  . Smoking status: Never Smoker  . Smokeless tobacco: Never Used  Substance and Sexual Activity  . Alcohol use: Yes    Comment: occasionally  . Drug use: No  . Sexual activity: Yes    Birth control/protection: None, Other-see comments    Comment: ablation  Lifestyle  . Physical activity:    Days per week: Not on file    Minutes per session: Not on file  . Stress: Not on file  Relationships  . Social connections:    Talks on phone: Not on file    Gets together: Not on file    Attends religious service: Not on file    Active member of club or organization: Not on file    Attends meetings of clubs or organizations: Not on file    Relationship status: Not on file  . Intimate partner violence:    Fear of current or ex partner: Not on file    Emotionally abused: Not on file    Physically abused: Not on file    Forced sexual activity: Not on file  Other Topics  Concern  . Not on file  Social History Narrative  . Not on file    Allergies  Allergen Reactions  . Depakote [Valproic Acid] Nausea And Vomiting    Outpatient Medications Prior to Visit  Medication Sig Dispense Refill  . lisinopril (PRINIVIL,ZESTRIL) 5 MG tablet Take 1 tablet (5 mg total) by mouth daily. 30 tablet 1  . LORazepam (ATIVAN) 1 MG tablet Take 1 tablet by mouth daily.    . metoprolol succinate (TOPROL-XL) 100 MG 24 hr tablet Take 1 tablet (100 mg total) by mouth daily. 90 tablet 3  . naproxen (NAPROSYN) 500 MG tablet Take 1 tablet (500 mg total) by mouth 2 (two) times daily. 180 tablet 3  . omeprazole (PRILOSEC) 40 MG capsule Take 40 mg by mouth 2 (two) times daily. otc    . sertraline (ZOLOFT) 50 MG tablet Take 1 tablet by mouth daily.    . traZODone (DESYREL) 50 MG tablet Take 1 tablet by  mouth 2 (two) times daily.    Marland Kitchen triamcinolone cream (KENALOG) 0.1 % Apply 1 application topically 2 (two) times daily. 30 g 0  . cyclobenzaprine (FLEXERIL) 10 MG tablet Take 1 tablet (10 mg total) by mouth 3 (three) times daily as needed for muscle spasms. 30 tablet 0  . meloxicam (MOBIC) 15 MG tablet Take 1 tablet (15 mg total) by mouth daily. 30 tablet 0  . HYDROcodone-acetaminophen (NORCO/VICODIN) 5-325 MG tablet Take 1 tablet by mouth every 8 (eight) hours as needed. (Patient not taking: Reported on 04/19/2017) 15 tablet 0  . ibuprofen (ADVIL,MOTRIN) 800 MG tablet Take 1 tablet (800 mg total) by mouth every 8 (eight) hours as needed for moderate pain. (Patient not taking: Reported on 04/11/2017) 15 tablet 0  . cefdinir (OMNICEF) 300 MG capsule Take 1 capsule (300 mg total) by mouth 2 (two) times daily. 20 capsule 0  . ondansetron (ZOFRAN ODT) 4 MG disintegrating tablet Take 1 tablet (4 mg total) by mouth every 8 (eight) hours as needed for nausea or vomiting. (Patient not taking: Reported on 04/11/2017) 15 tablet 0   No facility-administered medications prior to visit.     Review of Systems  Constitutional: Negative for chills, fever, malaise/fatigue and weight loss.  HENT: Negative for ear discharge, ear pain and sore throat.   Eyes: Negative for blurred vision.  Respiratory: Negative for cough, sputum production, shortness of breath and wheezing.   Cardiovascular: Negative for chest pain, palpitations and leg swelling.  Gastrointestinal: Negative for abdominal pain, blood in stool, bowel incontinence, constipation, diarrhea, heartburn, melena and nausea.  Genitourinary: Negative for bladder incontinence, dysuria, frequency, hematuria, pelvic pain and urgency.  Musculoskeletal: Positive for back pain. Negative for joint pain, myalgias and neck pain.  Skin: Negative for rash.  Neurological: Positive for paresthesias. Negative for dizziness, tingling, sensory change, focal weakness, numbness and  headaches.  Endo/Heme/Allergies: Negative for environmental allergies and polydipsia. Does not bruise/bleed easily.  Psychiatric/Behavioral: Negative for depression and suicidal ideas. The patient is not nervous/anxious and does not have insomnia.      Objective  Vitals:   04/19/17 1548  BP: 134/80  Pulse: 72  Weight: 295 lb (133.8 kg)  Height: 5\' 8"  (1.727 m)    Physical Exam  Constitutional: She is well-developed, well-nourished, and in no distress. No distress.  HENT:  Head: Normocephalic and atraumatic.  Right Ear: External ear normal.  Left Ear: External ear normal.  Nose: Nose normal.  Mouth/Throat: Oropharynx is clear and moist.  Eyes: Pupils are equal,  round, and reactive to light. Conjunctivae and EOM are normal. Right eye exhibits no discharge. Left eye exhibits no discharge.  Neck: Normal range of motion. Neck supple. No JVD present. No thyromegaly present.  Cardiovascular: Normal rate, regular rhythm, normal heart sounds and intact distal pulses. Exam reveals no gallop and no friction rub.  No murmur heard. Pulmonary/Chest: Effort normal and breath sounds normal. She has no wheezes. She has no rales.  Abdominal: Soft. Bowel sounds are normal. She exhibits no mass. There is no tenderness. There is no guarding.  Musculoskeletal: Normal range of motion. She exhibits no edema.       Lumbar back: She exhibits spasm. She exhibits no tenderness.  Lymphadenopathy:    She has no cervical adenopathy.  Neurological: She is alert. She has normal motor skills and normal reflexes. She has an abnormal Straight Leg Raise Test.  Reflex Scores:      Patellar reflexes are 2+ on the right side and 2+ on the left side.      Achilles reflexes are 2+ on the right side and 2+ on the left side. Skin: Skin is warm and dry. She is not diaphoretic.  Psychiatric: Mood and affect normal.  Nursing note and vitals reviewed.     Assessment & Plan  Problem List Items Addressed This Visit     None    Visit Diagnoses    Lumbar disc herniation with radiculopathy    -  Primary   no repetitive bending,twisting, nor lifting greater than 10 sat/sun/ doctor appt Monday   Relevant Medications   cyclobenzaprine (FLEXERIL) 10 MG tablet   Acute left-sided low back pain with left-sided sciatica       NEW   Relevant Medications   cyclobenzaprine (FLEXERIL) 10 MG tablet   meloxicam (MOBIC) 15 MG tablet      Meds ordered this encounter  Medications  . cyclobenzaprine (FLEXERIL) 10 MG tablet    Sig: Take 1 tablet (10 mg total) by mouth 3 (three) times daily as needed for muscle spasms.    Dispense:  30 tablet    Refill:  1  . meloxicam (MOBIC) 15 MG tablet    Sig: Take 1 tablet (15 mg total) by mouth daily.    Dispense:  30 tablet    Refill:  1      Dr. [facility]  04/19/17

## 2017-04-22 DIAGNOSIS — M5136 Other intervertebral disc degeneration, lumbar region: Secondary | ICD-10-CM | POA: Insufficient documentation

## 2017-04-22 DIAGNOSIS — M51369 Other intervertebral disc degeneration, lumbar region without mention of lumbar back pain or lower extremity pain: Secondary | ICD-10-CM | POA: Insufficient documentation

## 2017-04-23 ENCOUNTER — Other Ambulatory Visit: Payer: Self-pay

## 2017-05-14 ENCOUNTER — Other Ambulatory Visit: Payer: Self-pay | Admitting: Orthopedic Surgery

## 2017-05-14 DIAGNOSIS — M5136 Other intervertebral disc degeneration, lumbar region: Secondary | ICD-10-CM

## 2017-05-14 DIAGNOSIS — M5441 Lumbago with sciatica, right side: Principal | ICD-10-CM

## 2017-05-14 DIAGNOSIS — G8929 Other chronic pain: Secondary | ICD-10-CM

## 2017-10-30 ENCOUNTER — Ambulatory Visit: Payer: BLUE CROSS/BLUE SHIELD | Admitting: Family Medicine

## 2017-10-31 ENCOUNTER — Ambulatory Visit (INDEPENDENT_AMBULATORY_CARE_PROVIDER_SITE_OTHER): Payer: BLUE CROSS/BLUE SHIELD | Admitting: Family Medicine

## 2017-10-31 ENCOUNTER — Encounter: Payer: Self-pay | Admitting: Family Medicine

## 2017-10-31 VITALS — BP 102/80 | HR 76 | Ht 68.0 in | Wt 305.0 lb

## 2017-10-31 DIAGNOSIS — J40 Bronchitis, not specified as acute or chronic: Secondary | ICD-10-CM

## 2017-10-31 DIAGNOSIS — J0100 Acute maxillary sinusitis, unspecified: Secondary | ICD-10-CM

## 2017-10-31 MED ORDER — AMOXICILLIN 500 MG PO CAPS: 500.0000 mg | ORAL_CAPSULE | Freq: Three times a day (TID) | ORAL | 0 refills | Status: DC

## 2017-10-31 MED ORDER — GUAIFENESIN-CODEINE 100-10 MG/5ML PO SYRP: 5.0000 mL | ORAL_SOLUTION | Freq: Four times a day (QID) | ORAL | 0 refills | Status: DC | PRN

## 2017-10-31 NOTE — Progress Notes (Signed)
Date:  10/31/2017   Name:  Laura Christian   DOB:  1970/05/26   MRN:  [id number]   Chief Complaint: Cough (green production/ coughing spells) and Flu Vaccine Cough  This is a chronic problem. The current episode started more than 1 month ago (6 weeks). The problem has been waxing and waning. The cough is productive of purulent sputum. Associated symptoms include nasal congestion, postnasal drip and rhinorrhea. Pertinent negatives include no chest pain, chills, ear congestion, ear pain, eye redness, fever, headaches, hemoptysis, myalgias, rash, sore throat, shortness of breath, sweats, weight loss or wheezing. She has tried OTC cough suppressant for the symptoms. The treatment provided moderate relief. There is no history of asthma, bronchiectasis, bronchitis, COPD, emphysema, environmental allergies or pneumonia.     Review of Systems  Constitutional: Negative.  Negative for chills, fatigue, fever, unexpected weight change and weight loss.  HENT: Positive for postnasal drip and rhinorrhea. Negative for congestion, ear discharge, ear pain, sinus pressure, sneezing and sore throat.   Eyes: Negative for photophobia, pain, discharge, redness and itching.  Respiratory: Positive for cough. Negative for hemoptysis, shortness of breath, wheezing and stridor.   Cardiovascular: Negative for chest pain.  Gastrointestinal: Negative for abdominal pain, blood in stool, constipation, diarrhea, nausea and vomiting.  Endocrine: Negative for cold intolerance, heat intolerance, polydipsia, polyphagia and polyuria.  Genitourinary: Negative for dysuria, flank pain, frequency, hematuria, menstrual problem, pelvic pain, urgency, vaginal bleeding and vaginal discharge.  Musculoskeletal: Negative for arthralgias, back pain and myalgias.  Skin: Negative for rash.  Allergic/Immunologic: Negative for environmental allergies and food allergies.  Neurological: Negative for dizziness, weakness, light-headedness,  numbness and headaches.  Hematological: Negative for adenopathy. Does not bruise/bleed easily.  Psychiatric/Behavioral: Negative for dysphoric mood. The patient is not nervous/anxious.     Patient Active Problem List   Diagnosis Date Noted  . Uterine cramping 10/02/2016  . Chronic anxiety 10/02/2016  . Hypertension 10/02/2016  . Morbid obesity with BMI of 40.0-44.9, adult (HCC) 10/02/2016    Allergies  Allergen Reactions  . Depakote [Valproic Acid] Nausea And Vomiting    Past Surgical History:  Procedure Laterality Date  . ABLATION  05/2015  . MEDIAL PARTIAL KNEE REPLACEMENT Left     Social History   Tobacco Use  . Smoking status: Never Smoker  . Smokeless tobacco: Never Used  Substance Use Topics  . Alcohol use: Yes    Comment: occasionally  . Drug use: No     Medication list has been reviewed and updated.  Current Meds  Medication Sig  . cyclobenzaprine (FLEXERIL) 10 MG tablet Take 1 tablet (10 mg total) by mouth 3 (three) times daily as needed for muscle spasms.  Marland Kitchen LORazepam (ATIVAN) 1 MG tablet Take 1 tablet by mouth daily.  . metoprolol succinate (TOPROL-XL) 100 MG 24 hr tablet Take 1 tablet (100 mg total) by mouth daily.  . naproxen (NAPROSYN) 500 MG tablet Take 1 tablet (500 mg total) by mouth 2 (two) times daily.  Marland Kitchen omeprazole (PRILOSEC) 40 MG capsule Take 40 mg by mouth 2 (two) times daily. otc  . sertraline (ZOLOFT) 50 MG tablet Take 1 tablet by mouth daily.  . traZODone (DESYREL) 50 MG tablet Take 1 tablet by mouth 2 (two) times daily.  Marland Kitchen triamcinolone cream (KENALOG) 0.1 % Apply 1 application topically 2 (two) times daily.    PHQ 2/9 Scores 10/31/2017 01/25/2017  PHQ - 2 Score 0 0  PHQ- 9 Score 0 0    Physical Exam  Constitutional: She is oriented to person, place, and time. She appears well-developed and well-nourished.  HENT:  Head: Normocephalic.  Right Ear: Tympanic membrane and external ear normal.  Left Ear: Tympanic membrane and external  ear normal.  Nose: Right sinus exhibits maxillary sinus tenderness. Left sinus exhibits maxillary sinus tenderness.  Mouth/Throat: Posterior oropharyngeal erythema present. No oropharyngeal exudate or posterior oropharyngeal edema.  Eyes: Pupils are equal, round, and reactive to light. Conjunctivae and EOM are normal. Lids are everted and swept, no foreign bodies found. Left eye exhibits no hordeolum. No foreign body present in the left eye. Right conjunctiva is not injected. Left conjunctiva is not injected. No scleral icterus.  Neck: Normal range of motion. Neck supple. No JVD present. No tracheal deviation present. No thyromegaly present.  Cardiovascular: Normal rate, regular rhythm, normal heart sounds and intact distal pulses. Exam reveals no gallop and no friction rub.  No murmur heard. Pulmonary/Chest: Effort normal and breath sounds normal. No respiratory distress. She has no decreased breath sounds. She has no wheezes. She has no rhonchi. She has no rales.  Abdominal: Soft. Bowel sounds are normal. She exhibits no mass. There is no hepatosplenomegaly. There is no tenderness. There is no rebound and no guarding.  Musculoskeletal: Normal range of motion. She exhibits no edema or tenderness.  Lymphadenopathy:       Head (right side): No submandibular adenopathy present.       Head (left side): No submandibular adenopathy present.    She has no cervical adenopathy.  Neurological: She is alert and oriented to person, place, and time. She has normal strength. She displays normal reflexes. No cranial nerve deficit.  Skin: Skin is warm. No rash noted.  Psychiatric: She has a normal mood and affect. Her mood appears not anxious. She does not exhibit a depressed mood.  Nursing note and vitals reviewed.   BP 102/80   Pulse 76   Ht 5\' 8"  (1.727 m)   Wt (!) 305 lb (138.3 kg)   BMI 46.38 kg/m   Assessment and Plan:  1. Acute maxillary sinusitis, recurrence not specified Acute Persistent  Prescribe Amoxil 500 mg tid for 10 days - amoxicillin (AMOXIL) 500 MG capsule; Take 1 capsule (500 mg total) by mouth 3 (three) times daily.  Dispense: 30 capsule; Refill: 0  2. Bronchitis Persistent. Prescribe Robitussin AC 1 tsp q 6 hr prn cough. - guaiFENesin-codeine (ROBITUSSIN AC) 100-10 MG/5ML syrup; Take 5 mLs by mouth 4 (four) times daily as needed for cough.  Dispense: 100 mL; Refill: 0   Dr. Elizabeth Sauer [facility]  10/31/2017

## 2018-02-17 ENCOUNTER — Other Ambulatory Visit: Payer: Self-pay | Admitting: Family Medicine

## 2018-02-17 DIAGNOSIS — Z9889 Other specified postprocedural states: Secondary | ICD-10-CM

## 2018-03-20 ENCOUNTER — Other Ambulatory Visit: Payer: Self-pay | Admitting: Family Medicine

## 2018-03-20 DIAGNOSIS — Z9889 Other specified postprocedural states: Secondary | ICD-10-CM

## 2018-04-22 ENCOUNTER — Other Ambulatory Visit: Payer: Self-pay | Admitting: Family Medicine

## 2018-04-22 DIAGNOSIS — Z9889 Other specified postprocedural states: Secondary | ICD-10-CM

## 2018-05-12 ENCOUNTER — Other Ambulatory Visit: Payer: Self-pay | Admitting: Family Medicine

## 2018-05-12 DIAGNOSIS — Z9889 Other specified postprocedural states: Secondary | ICD-10-CM

## 2018-05-12 DIAGNOSIS — I10 Essential (primary) hypertension: Secondary | ICD-10-CM

## 2018-05-13 ENCOUNTER — Other Ambulatory Visit: Payer: Self-pay

## 2018-05-13 ENCOUNTER — Ambulatory Visit: Payer: BLUE CROSS/BLUE SHIELD | Admitting: Family Medicine

## 2018-05-13 ENCOUNTER — Encounter: Payer: Self-pay | Admitting: Family Medicine

## 2018-05-13 VITALS — BP 120/80 | HR 64 | Ht 68.0 in | Wt 260.0 lb

## 2018-05-13 DIAGNOSIS — I10 Essential (primary) hypertension: Secondary | ICD-10-CM | POA: Diagnosis not present

## 2018-05-13 DIAGNOSIS — Z9889 Other specified postprocedural states: Secondary | ICD-10-CM | POA: Diagnosis not present

## 2018-05-13 DIAGNOSIS — Z6839 Body mass index (BMI) 39.0-39.9, adult: Secondary | ICD-10-CM

## 2018-05-13 DIAGNOSIS — E785 Hyperlipidemia, unspecified: Secondary | ICD-10-CM

## 2018-05-13 MED ORDER — METOPROLOL SUCCINATE ER 100 MG PO TB24: 100.0000 mg | ORAL_TABLET | Freq: Every day | ORAL | 1 refills | Status: DC

## 2018-05-13 MED ORDER — NAPROXEN 500 MG PO TABS: 500.0000 mg | ORAL_TABLET | Freq: Two times a day (BID) | ORAL | 1 refills | Status: DC

## 2018-05-13 NOTE — Patient Instructions (Signed)

## 2018-05-13 NOTE — Progress Notes (Signed)
Date:  05/13/2018   Name:  Laura Christian   DOB:  1970/08/26   MRN:  [id number]   Chief Complaint: Hypertension and Arthritis (needs naprosyn refilled)  Hypertension  This is a chronic problem. The current episode started more than 1 year ago. The problem is unchanged. The problem is controlled. Pertinent negatives include no anxiety, blurred vision, chest pain, headaches, malaise/fatigue, neck pain, orthopnea, palpitations, peripheral edema, PND, shortness of breath or sweats. There are no associated agents to hypertension. Risk factors for coronary artery disease include obesity. Past treatments include beta blockers. The current treatment provides moderate improvement. There are no compliance problems.  There is no history of angina, kidney disease, CAD/MI, CVA, heart failure, left ventricular hypertrophy, PVD or retinopathy. There is no history of chronic renal disease, a hypertension causing med or renovascular disease.  Arthritis  Presents for follow-up visit. She complains of pain and stiffness. The symptoms have been stable (when taking medication). Affected locations include the left knee, left hip, right hip and right knee. Her pain is at a severity of 8/10. Associated symptoms include weight loss. Pertinent negatives include no diarrhea, dry eyes, dry mouth, dysuria, fatigue, fever, pain at night, pain while resting, rash, Raynaud's syndrome or uveitis. (By design/ self motivated/fasting)  Hyperlipidemia  This is a chronic problem. The current episode started more than 1 year ago. The problem is controlled. Exacerbating diseases include obesity. She has no history of chronic renal disease, diabetes or hypothyroidism. Pertinent negatives include no chest pain, myalgias or shortness of breath. Current antihyperlipidemic treatment includes diet change. The current treatment provides moderate improvement of lipids. There are no compliance problems.     Review of Systems  Constitutional:  Positive for weight loss. Negative for chills, fatigue, fever, malaise/fatigue and unexpected weight change.  HENT: Negative for congestion, ear discharge, ear pain, rhinorrhea, sinus pressure, sneezing and sore throat.   Eyes: Negative for blurred vision, photophobia, pain, discharge, redness and itching.  Respiratory: Negative for cough, shortness of breath, wheezing and stridor.   Cardiovascular: Negative for chest pain, palpitations, orthopnea and PND.  Gastrointestinal: Negative for abdominal pain, blood in stool, constipation, diarrhea, nausea and vomiting.  Endocrine: Negative for cold intolerance, heat intolerance, polydipsia, polyphagia and polyuria.  Genitourinary: Negative for dysuria, flank pain, frequency, hematuria, menstrual problem, pelvic pain, urgency, vaginal bleeding and vaginal discharge.  Musculoskeletal: Positive for arthritis and stiffness. Negative for arthralgias, back pain, myalgias and neck pain.  Skin: Negative for rash.  Allergic/Immunologic: Negative for environmental allergies and food allergies.  Neurological: Negative for dizziness, weakness, light-headedness, numbness and headaches.  Hematological: Negative for adenopathy. Does not bruise/bleed easily.  Psychiatric/Behavioral: Negative for dysphoric mood. The patient is not nervous/anxious.     Patient Active Problem List   Diagnosis Date Noted  . Uterine cramping 10/02/2016  . Chronic anxiety 10/02/2016  . Hypertension 10/02/2016  . Morbid obesity with BMI of 40.0-44.9, adult (HCC) 10/02/2016    Allergies  Allergen Reactions  . Depakote [Valproic Acid] Nausea And Vomiting    Past Surgical History:  Procedure Laterality Date  . ABLATION  05/2015  . MEDIAL PARTIAL KNEE REPLACEMENT Left     Social History   Tobacco Use  . Smoking status: Never Smoker  . Smokeless tobacco: Never Used  Substance Use Topics  . Alcohol use: Yes    Comment: occasionally  . Drug use: No     Medication list has  been reviewed and updated.  Current Meds  Medication Sig  .  cyclobenzaprine (FLEXERIL) 10 MG tablet Take 1 tablet (10 mg total) by mouth 3 (three) times daily as needed for muscle spasms.  Marland Kitchen. LORazepam (ATIVAN) 1 MG tablet Take 1 tablet by mouth daily.  . metoprolol succinate (TOPROL-XL) 100 MG 24 hr tablet Take 1 tablet by mouth once daily  . naproxen (NAPROSYN) 500 MG tablet Take 1 tablet by mouth twice daily  . omeprazole (PRILOSEC) 40 MG capsule Take 40 mg by mouth 2 (two) times daily. otc  . sertraline (ZOLOFT) 50 MG tablet Take 1 tablet by mouth daily.  . traZODone (DESYREL) 50 MG tablet Take 1 tablet by mouth 2 (two) times daily.  Marland Kitchen. triamcinolone cream (KENALOG) 0.1 % Apply 1 application topically 2 (two) times daily.    PHQ 2/9 Scores 05/13/2018 10/31/2017 01/25/2017  PHQ - 2 Score 0 0 0  PHQ- 9 Score 0 0 0    BP Readings from Last 3 Encounters:  05/13/18 120/80  10/31/17 102/80  04/19/17 134/80    Physical Exam Vitals signs and nursing note reviewed.  Constitutional:      General: She is not in acute distress.    Appearance: She is not diaphoretic.  HENT:     Head: Normocephalic and atraumatic.     Right Ear: Tympanic membrane, ear canal and external ear normal.     Left Ear: Tympanic membrane, ear canal and external ear normal.     Nose: Nose normal.  Eyes:     General:        Right eye: No discharge.        Left eye: No discharge.     Conjunctiva/sclera: Conjunctivae normal.     Pupils: Pupils are equal, round, and reactive to light.  Neck:     Musculoskeletal: Normal range of motion and neck supple.     Thyroid: No thyromegaly.     Vascular: No JVD.  Cardiovascular:     Rate and Rhythm: Normal rate and regular rhythm.     Heart sounds: Normal heart sounds, S1 normal and S2 normal. No murmur. No systolic murmur. No diastolic murmur. No friction rub. No gallop. No S3 or S4 sounds.   Pulmonary:     Effort: Pulmonary effort is normal.     Breath sounds: Normal  breath sounds.  Abdominal:     General: Bowel sounds are normal.     Palpations: Abdomen is soft. There is no mass.     Tenderness: There is no abdominal tenderness. There is no guarding.  Musculoskeletal: Normal range of motion.  Lymphadenopathy:     Cervical: No cervical adenopathy.  Skin:    General: Skin is warm and dry.  Neurological:     Mental Status: She is alert.     Deep Tendon Reflexes: Reflexes are normal and symmetric.     Wt Readings from Last 3 Encounters:  05/13/18 260 lb (117.9 kg)  10/31/17 (!) 305 lb (138.3 kg)  04/19/17 295 lb (133.8 kg)    BP 120/80   Pulse 64   Ht 5\' 8"  (1.727 m)   Wt 260 lb (117.9 kg)   BMI 39.53 kg/m   Assessment and Plan:  1. History of knee surgery Patient with a history of arthritis and knee surgery.  This is controlled on on naproxen.  Patient will continue naproxen 500 mg twice a day. - naproxen (NAPROSYN) 500 MG tablet; Take 1 tablet (500 mg total) by mouth 2 (two) times daily.  Dispense: 180 tablet; Refill: 1  2. Essential  hypertension Chronic.  Controlled.  Continue metoprolol XL 100 mg once a day.  Will check renal function panel as well as lipid panel. - metoprolol succinate (TOPROL-XL) 100 MG 24 hr tablet; Take 1 tablet (100 mg total) by mouth daily. Take with or immediately following a meal.  Dispense: 90 tablet; Refill: 1 - Renal Function Panel - Lipid panel  3. Class 2 severe obesity due to excess calories with serious comorbidity and body mass index (BMI) of 39.0 to 39.9 in adult [facility]) Patient had significant obesity with her hypertension.  But she is lost 80 pounds in the last 6 months - Lipid panel  4. Hyperlipidemia, unspecified hyperlipidemia type Chronic.  Controlled.  Patient was given low triglyceride cholesterol sheets.  Will check lipid panel and probably start statin as needed. - Lipid panel

## 2018-05-14 ENCOUNTER — Ambulatory Visit: Payer: Self-pay | Admitting: Family Medicine

## 2018-05-14 LAB — RENAL FUNCTION PANEL
Albumin: 4.2 g/dL (ref 3.8–4.8)
BUN/Creatinine Ratio: 23 (ref 9–23)
BUN: 16 mg/dL (ref 6–24)
CO2: 24 mmol/L (ref 20–29)
Calcium: 9.3 mg/dL (ref 8.7–10.2)
Chloride: 106 mmol/L (ref 96–106)
Creatinine, Ser: 0.69 mg/dL (ref 0.57–1.00)
GFR calc Af Amer: 120 mL/min/{1.73_m2} (ref >59)
GFR calc non Af Amer: 104 mL/min/{1.73_m2} (ref >59)
Glucose: 86 mg/dL (ref 65–99)
Phosphorus: 4.3 mg/dL (ref 3.0–4.3)
Potassium: 4.9 mmol/L (ref 3.5–5.2)
Sodium: 142 mmol/L (ref 134–144)

## 2018-05-14 LAB — LIPID PANEL
Chol/HDL Ratio: 4.3 ratio (ref 0.0–4.4)
Cholesterol, Total: 173 mg/dL (ref 100–199)
HDL: 40 mg/dL (ref >39)
LDL Calculated: 106 mg/dL — ABNORMAL HIGH (ref 0–99)
Triglycerides: 137 mg/dL (ref 0–149)
VLDL Cholesterol Cal: 27 mg/dL (ref 5–40)

## 2018-05-26 ENCOUNTER — Encounter: Payer: Self-pay | Admitting: Family Medicine

## 2018-05-26 ENCOUNTER — Ambulatory Visit (INDEPENDENT_AMBULATORY_CARE_PROVIDER_SITE_OTHER): Payer: BLUE CROSS/BLUE SHIELD | Admitting: Family Medicine

## 2018-05-26 ENCOUNTER — Other Ambulatory Visit: Payer: Self-pay

## 2018-05-26 VITALS — BP 100/60 | HR 97 | Temp 98.2°F | Ht 68.0 in | Wt 257.0 lb

## 2018-05-26 DIAGNOSIS — J020 Streptococcal pharyngitis: Secondary | ICD-10-CM | POA: Diagnosis not present

## 2018-05-26 LAB — POCT RAPID STREP A (OFFICE): Rapid Strep A Screen: POSITIVE — AB

## 2018-05-26 MED ORDER — AZITHROMYCIN 250 MG PO TABS: ORAL_TABLET | ORAL | 0 refills | Status: DC

## 2018-05-26 NOTE — Progress Notes (Signed)
Date:  05/26/2018   Name:  Laura Christian   DOB:  [birth year]   MRN:  [id number]   Chief Complaint: Sinusitis (started Sat night with scratchy throat and chills. R) side of throat is sore and body aches, runny nose. No exposure/ mild fever/ no travel)  Sinusitis  This is a new problem. The current episode started in the past 7 days (saturday). The problem has been gradually worsening since onset. Maximum temperature: felt low grade. Her pain is at a severity of 8/10 (throat). The pain is moderate. Associated symptoms include chills, congestion, coughing, headaches, a hoarse voice, neck pain, sinus pressure, sneezing and a sore throat. Pertinent negatives include no diaphoresis, ear pain, shortness of breath or swollen glands. Past treatments include nothing. The treatment provided mild relief.    Review of Systems  Constitutional: Positive for chills. Negative for diaphoresis, fatigue, fever and unexpected weight change.  HENT: Positive for congestion, hoarse voice, sinus pressure, sneezing and sore throat. Negative for drooling, ear discharge, ear pain, mouth sores, nosebleeds, postnasal drip, rhinorrhea, sinus pain, tinnitus, trouble swallowing and voice change.   Eyes: Negative for photophobia, pain, discharge, redness and itching.  Respiratory: Positive for cough. Negative for shortness of breath, wheezing and stridor.   Gastrointestinal: Negative for abdominal pain, blood in stool, constipation, diarrhea, nausea and vomiting.  Endocrine: Negative for cold intolerance, heat intolerance, polydipsia, polyphagia and polyuria.  Genitourinary: Negative for dysuria, flank pain, frequency, hematuria, menstrual problem, pelvic pain, urgency, vaginal bleeding and vaginal discharge.  Musculoskeletal: Positive for neck pain. Negative for arthralgias, back pain and myalgias.  Skin: Negative for rash.  Allergic/Immunologic: Negative for environmental allergies and food allergies.  Neurological:  Positive for headaches. Negative for dizziness, weakness, light-headedness and numbness.  Hematological: Negative for adenopathy. Does not bruise/bleed easily.  Psychiatric/Behavioral: Negative for dysphoric mood. The patient is not nervous/anxious.     Patient Active Problem List   Diagnosis Date Noted  . Uterine cramping 10/02/2016  . Chronic anxiety 10/02/2016  . Hypertension 10/02/2016    Allergies  Allergen Reactions  . Depakote [Valproic Acid] Nausea And Vomiting    Past Surgical History:  Procedure Laterality Date  . ABLATION  05/2015  . MEDIAL PARTIAL KNEE REPLACEMENT Left     Social History   Tobacco Use  . Smoking status: Never Smoker  . Smokeless tobacco: Never Used  Substance Use Topics  . Alcohol use: Yes    Comment: occasionally  . Drug use: No     Medication list has been reviewed and updated.  Current Meds  Medication Sig  . cyclobenzaprine (FLEXERIL) 10 MG tablet Take 1 tablet (10 mg total) by mouth 3 (three) times daily as needed for muscle spasms.  Marland Kitchen LORazepam (ATIVAN) 1 MG tablet Take 1 tablet by mouth daily.  . metoprolol succinate (TOPROL-XL) 100 MG 24 hr tablet Take 1 tablet (100 mg total) by mouth daily. Take with or immediately following a meal.  . naproxen (NAPROSYN) 500 MG tablet Take 1 tablet (500 mg total) by mouth 2 (two) times daily.  Marland Kitchen omeprazole (PRILOSEC) 40 MG capsule Take 40 mg by mouth 2 (two) times daily. otc  . sertraline (ZOLOFT) 50 MG tablet Take 1 tablet by mouth daily.  . traZODone (DESYREL) 50 MG tablet Take 1 tablet by mouth 2 (two) times daily.  Marland Kitchen triamcinolone cream (KENALOG) 0.1 % Apply 1 application topically 2 (two) times daily.    PHQ 2/9 Scores 05/26/2018 05/13/2018 10/31/2017 01/25/2017  PHQ - 2 Score  0 0 0 0  PHQ- 9 Score 0 0 0 0    BP Readings from Last 3 Encounters:  05/26/18 100/60  05/13/18 120/80  10/31/17 102/80    Physical Exam Vitals signs and nursing note reviewed.  Constitutional:      General:  She is not in acute distress.    Appearance: She is not diaphoretic.  HENT:     Head: Normocephalic and atraumatic.     Right Ear: Tympanic membrane, ear canal and external ear normal.     Left Ear: Tympanic membrane, ear canal and external ear normal.     Nose: Congestion present. No rhinorrhea.     Mouth/Throat:     Mouth: Mucous membranes are moist.     Pharynx: Oropharynx is clear. Posterior oropharyngeal erythema present. No oropharyngeal exudate.  Eyes:     General:        Right eye: No discharge.        Left eye: No discharge.     Conjunctiva/sclera: Conjunctivae normal.     Pupils: Pupils are equal, round, and reactive to light.  Neck:     Musculoskeletal: Normal range of motion and neck supple.     Thyroid: No thyromegaly.     Vascular: No JVD.  Cardiovascular:     Rate and Rhythm: Normal rate and regular rhythm.     Heart sounds: Normal heart sounds. No murmur. No friction rub. No gallop.   Pulmonary:     Effort: Pulmonary effort is normal.     Breath sounds: Normal breath sounds. No transmitted upper airway sounds. No decreased breath sounds, wheezing, rhonchi or rales.  Abdominal:     General: Bowel sounds are normal.     Palpations: Abdomen is soft. There is no mass.     Tenderness: There is no abdominal tenderness. There is no guarding.  Musculoskeletal: Normal range of motion.  Lymphadenopathy:     Head:     Right side of head: No submandibular or tonsillar adenopathy.     Left side of head: No submandibular or tonsillar adenopathy.     Cervical: No cervical adenopathy.     Right cervical: No superficial cervical adenopathy.    Left cervical: No superficial cervical adenopathy.  Skin:    General: Skin is warm and dry.  Neurological:     Mental Status: She is alert.     Deep Tendon Reflexes: Reflexes are normal and symmetric.     Wt Readings from Last 3 Encounters:  05/26/18 257 lb (116.6 kg)  05/13/18 260 lb (117.9 kg)  10/31/17 (!) 305 lb (138.3 kg)     BP 100/60   Pulse 97   Temp 98.2 F (36.8 C) (Oral)   Ht 5\' 8"  (1.727 m)   Wt 257 lb (116.6 kg)   SpO2 100%   BMI 39.08 kg/m   Assessment and Plan:  1. Pharyngitis due to Streptococcus species Acute.  Persistent.  Patient was noted to have a positive POCT rapid strep test.  Patient has had a sore throat for several days for which we will initiate a azithromycin 250 mg 2 today followed by 1 a day for 4 days. - azithromycin (ZITHROMAX) 250 MG tablet; 2 today then 1 a day for 4 days  Dispense: 6 tablet; Refill: 0 - POCT rapid strep A

## 2018-10-29 ENCOUNTER — Ambulatory Visit (INDEPENDENT_AMBULATORY_CARE_PROVIDER_SITE_OTHER): Payer: BC Managed Care – PPO

## 2018-10-29 ENCOUNTER — Ambulatory Visit
Admission: EM | Admit: 2018-10-29 | Discharge: 2018-10-29 | Disposition: A | Discharge status: Home / Self Care | Payer: BC Managed Care – PPO | Attending: Family Medicine | Admitting: Family Medicine

## 2018-10-29 ENCOUNTER — Other Ambulatory Visit: Payer: Self-pay

## 2018-10-29 DIAGNOSIS — M25531 Pain in right wrist: Secondary | ICD-10-CM

## 2018-10-29 DIAGNOSIS — M25521 Pain in right elbow: Secondary | ICD-10-CM

## 2018-10-29 DIAGNOSIS — S52124A Nondisplaced fracture of head of right radius, initial encounter for closed fracture: Secondary | ICD-10-CM

## 2018-10-29 DIAGNOSIS — S40811A Abrasion of right upper arm, initial encounter: Secondary | ICD-10-CM | POA: Diagnosis not present

## 2018-10-29 DIAGNOSIS — W0110XA Fall on same level from slipping, tripping and stumbling with subsequent striking against unspecified object, initial encounter: Secondary | ICD-10-CM

## 2018-10-29 DIAGNOSIS — M79641 Pain in right hand: Secondary | ICD-10-CM | POA: Diagnosis not present

## 2018-10-29 DIAGNOSIS — S40021A Contusion of right upper arm, initial encounter: Secondary | ICD-10-CM | POA: Diagnosis not present

## 2018-10-29 MED ORDER — OXYCODONE-ACETAMINOPHEN 5-325 MG PO TABS: 1.0000 | ORAL_TABLET | Freq: Three times a day (TID) | ORAL | 0 refills | Status: DC | PRN

## 2018-10-29 NOTE — Discharge Instructions (Addendum)
Take medication as prescribed. Rest. Drink plenty of fluids. Ice. Elevate. Keep in splint.   Follow-up with orthopedic this week as discussed.  Call today to schedule.  Follow up with your primary care physician this week as needed. Return to Urgent care for new or worsening concerns.

## 2018-10-29 NOTE — ED Provider Notes (Signed)
[facility] ____________________________________________  Time seen: Approximately 10:11 AM  I have reviewed the triage vital signs and the nursing notes.   HISTORY  Chief Complaint Fall and Arm Pain (right)   HPI Laura Christian is a 48 y.o. female presenting for evaluation of right arm pain after injury that occurred on Monday.  Reports she was walking her 120 pound great Dane who was spooked by another dog, and subsequently pulled her down and onto the concrete.  Patient states that she did have a leash in her right hand but also tried to catch herself with her right hand.  States she developed scratches from the concrete.  Reports her tetanus immunization is up-to-date.  States she has majority pain to right fourth and fifth fingers as well as right wrist.  Intermittent pain to right upper arm, states this pain feels more of a sore pain and radiates from the right wrist.  Denies other injuries.  No fevers.  Reports otherwise doing well.  No recent cough or sickness.  Pain is worse with activity and movement.  Duanne LimerickJones, Deanna C, MD: PCP   Past Medical History:  Diagnosis Date  . Anxiety   . Depression   . Family history of ovarian cancer    10/18 cancer genetic testing letter sent  . GERD (gastroesophageal reflux disease)   . Hypertension   . Insomnia     Patient Active Problem List   Diagnosis Date Noted  . Uterine cramping 10/02/2016  . Chronic anxiety 10/02/2016  . Hypertension 10/02/2016    Past Surgical History:  Procedure Laterality Date  . ABLATION  05/2015  . MEDIAL PARTIAL KNEE REPLACEMENT Left      No current facility-administered medications for this encounter.   Current Outpatient Medications:  .  LORazepam (ATIVAN) 1 MG tablet, Take 1 tablet by mouth daily., Disp: , Rfl:  .  metoprolol succinate (TOPROL-XL) 100 MG 24 hr tablet, Take 1 tablet (100 mg total) by mouth daily. Take with or immediately following a meal., Disp: 90 tablet, Rfl: 1  .  naproxen (NAPROSYN) 500 MG tablet, Take 1 tablet (500 mg total) by mouth 2 (two) times daily., Disp: 180 tablet, Rfl: 1 .  omeprazole (PRILOSEC) 40 MG capsule, Take 40 mg by mouth 2 (two) times daily. otc, Disp: , Rfl:  .  sertraline (ZOLOFT) 50 MG tablet, Take 1 tablet by mouth daily., Disp: , Rfl:  .  traZODone (DESYREL) 50 MG tablet, Take 1 tablet by mouth 2 (two) times daily., Disp: , Rfl:  .  triamcinolone cream (KENALOG) 0.1 %, Apply 1 application topically 2 (two) times daily., Disp: 30 g, Rfl: 0 .  oxyCODONE-acetaminophen (PERCOCET/ROXICET) 5-325 MG tablet, Take 1 tablet by mouth every 8 (eight) hours as needed for severe pain. Do not drive while taking as can cause drowsiness., Disp: 10 tablet, Rfl: 0  Allergies Depakote [valproic acid]  Family History  Problem Relation Age of Onset  . Ovarian cancer Maternal Grandmother [id number]  . Lung cancer Paternal Grandmother [id number]  . Diabetes Neg Hx   . Hypertension Neg Hx   . Stroke Neg Hx   . Thyroid disease Neg Hx     Social History Social History   Tobacco Use  . Smoking status: Never Smoker  . Smokeless tobacco: Never Used  Substance Use Topics  . Alcohol use: Yes    Comment: occasionally  . Drug use: No    Review of Systems Constitutional: No fever ENT: No sore throat. Cardiovascular: Denies chest  pain. Respiratory: Denies shortness of breath. Gastrointestinal: No abdominal pain.  Musculoskeletal: Right arm pain. Skin: Negative for rash.   ____________________________________________   PHYSICAL EXAM:  VITAL SIGNS: ED Triage Vitals  Enc Vitals Group     BP 10/29/18 0949 (!) 130/95     Pulse Rate 10/29/18 0949 73     Resp 10/29/18 0949 16     Temp 10/29/18 0949 98.2 F (36.8 C)     Temp Source 10/29/18 0949 Oral     SpO2 10/29/18 0949 96 %     Weight 10/29/18 0947 247 lb (112 kg)     Height 10/29/18 0947 5\' 8"  (1.727 m)     Head Circumference --      Peak Flow --      Pain Score 10/29/18 0947 6     Pain Loc  --      Pain Edu? --      Excl. in GC? --     Constitutional: Alert and oriented. Well appearing and in no acute distress. Eyes: Conjunctivae are normal.  ENT      Head: Normocephalic and atraumatic. Cardiovascular: Normal rate, regular rhythm. Grossly normal heart sounds.  Good peripheral circulation. Respiratory: Normal respiratory effort without tachypnea nor retractions. Breath sounds are clear and equal bilaterally. No wheezes, rales, rhonchi. Musculoskeletal:  Steady gait. Bilateral distal radial pulses equal and easily palpated.   Except: Right dorsal proximal fourth and fifth digits and scattered right forearm superficial abrasions with minimal erythema, no surrounding erythema and no drainage.  Right fourth and fifth proximal digits and 5th metacarpal tenderness to direct palpation with diffuse moderate tenderness to medial and lateral right wrist.  Minimal tenderness to right proximal arm diffusely without erythema, edema or ecchymosis, and good resisted strength to shoulder as well as biceps/triceps.  Right wrist able to flex and extend as well as rotate but with pain and guarding.  Normal distal sensation to her right hand fingers with good resisted distal flexion and extension to fingers. Neurologic:  Normal speech and language. No gross focal neurologic deficits are appreciated. Speech is normal. No gait instability.  Skin:  Skin is warm, dry Psychiatric: Mood and affect are normal. Speech and behavior are normal. Patient exhibits appropriate insight and judgment   ___________________________________________   LABS (all labs ordered are listed, but only abnormal results are displayed)  Labs Reviewed - No data to display  RADIOLOGY  Dg Elbow Complete Right  Result Date: 10/29/2018 CLINICAL DATA:  Pain after fall EXAM: RIGHT ELBOW - COMPLETE 3+ VIEW COMPARISON:  None. FINDINGS: Radial head fracture which roughly divides the radial head with less than 1 mm of depression of the  anterior fragment. Joint effusion as expected. No malalignment of the joint. IMPRESSION: 1. Radial head fracture with less than 1 mm of fragment depression. 2. Joint effusion. Electronically Signed   By: 10/31/2018 M.D.   On: 10/29/2018 11:07   Dg Wrist Complete Right  Result Date: 10/29/2018 CLINICAL DATA:  Right arm pain and swelling after fall EXAM: RIGHT WRIST - COMPLETE 3+ VIEW COMPARISON:  None. FINDINGS: Mild soft tissue swelling along the medial wrist. No acute fracture or dislocation. IMPRESSION: Mild soft tissue swelling without wrist fracture. Electronically Signed   By: 10/31/2018 M.D.   On: 10/29/2018 11:06   Dg Humerus Right  Result Date: 10/29/2018 CLINICAL DATA:  Arm pain after fall. EXAM: RIGHT HUMERUS - 2+ VIEW COMPARISON:  None. FINDINGS: Radial head fracture that is nondisplaced on the  submitted images. Negative for humerus fracture. Inferior AC joint spurring that is mild. IMPRESSION: 1. Radial head fracture, reference elbow study. 2. Negative for humerus fracture. Electronically Signed   By: Monte Fantasia M.D.   On: 10/29/2018 11:05   Dg Hand Complete Right  Result Date: 10/29/2018 CLINICAL DATA:  Right fourth and fifth MCP joint region pain. Initial encounter. EXAM: RIGHT HAND - COMPLETE 3+ VIEW COMPARISON:  None. FINDINGS: There is no evidence of fracture or dislocation. Small interphalangeal joint spurs. IMPRESSION: Negative for acute fracture. Electronically Signed   By: Monte Fantasia M.D.   On: 10/29/2018 11:32   ____________________________________________   PROCEDURES Procedures   Right arm posterior OCL long-arm splint applied and sling given. INITIAL IMPRESSION / ASSESSMENT AND PLAN / ED COURSE  Pertinent labs & imaging results that were available during my care of the patient were reviewed by me and considered in my medical decision making (see chart for details).  Well-appearing patient.  No acute distress.  Right arm pain post mechanical  injury that occurred 2 days ago.  Superficial abrasions to right arm.  Tetanus immunization is up-to-date per patient.  X-ray of right hand, right wrist and right humerus.  xrays as above and reviewed.  X-rays negative per radiologist, right humerus, right wrist and right hand negative for acute fracture, right elbow radial head fracture with less than 1 mm of fragment depression with joint effusion.  Splint applied and sling given.  PRN Percocet as needed for breakthrough pain.  Follow-up with orthopedic this week.  Ice, elevate and keep in splint.Discussed indication, risks and benefits of medications with patient.   Discussed follow up with Primary care physician this week. Discussed follow up and return parameters including no resolution or any worsening concerns. Patient verbalized understanding and agreed to plan.    [place] controlled substance database reviewed, no recent breakthrough controlled substances documented. ____________________________________________   FINAL CLINICAL IMPRESSION(S) / ED DIAGNOSES  Final diagnoses:  Closed nondisplaced fracture of head of right radius, initial encounter  Arm contusion, right, initial encounter  Abrasion of right upper extremity, initial encounter     ED Discharge Orders         Ordered    oxyCODONE-acetaminophen (PERCOCET/ROXICET) 5-325 MG tablet  Every 8 hours PRN     10/29/18 1146           Note: This dictation was prepared with Dragon dictation along with smaller phrase technology. Any transcriptional errors that result from this process are unintentional.         Marylene Land, NP 10/29/18 1305

## 2018-10-29 NOTE — ED Triage Notes (Signed)
Patient complains of a fall that occurred while walking her great dane Monday night. States that great dane pulled her to the ground after another dog came by. Patient now with pain in right arm radiates through bicep. Patient also having right wrist pain and also having pinky and ring finger pain. Patient states that she has no grip with her hand, pain and swelling.

## 2018-10-31 ENCOUNTER — Other Ambulatory Visit: Payer: Self-pay

## 2018-10-31 ENCOUNTER — Encounter: Payer: Self-pay | Admitting: Family Medicine

## 2018-10-31 ENCOUNTER — Ambulatory Visit (INDEPENDENT_AMBULATORY_CARE_PROVIDER_SITE_OTHER): Payer: BC Managed Care – PPO | Admitting: Family Medicine

## 2018-10-31 VITALS — BP 130/88 | HR 76 | Ht 68.0 in | Wt 248.0 lb

## 2018-10-31 DIAGNOSIS — Z23 Encounter for immunization: Secondary | ICD-10-CM | POA: Diagnosis not present

## 2018-10-31 DIAGNOSIS — R5383 Other fatigue: Secondary | ICD-10-CM

## 2018-10-31 DIAGNOSIS — R634 Abnormal weight loss: Secondary | ICD-10-CM | POA: Diagnosis not present

## 2018-10-31 DIAGNOSIS — R61 Generalized hyperhidrosis: Secondary | ICD-10-CM

## 2018-10-31 NOTE — Progress Notes (Signed)
Date:  10/31/2018   Name:  Laura Christian   DOB:  [birth year]   MRN:  [id number]   Chief Complaint: Hot Flashes (Drinched in sweat then freezing a minute later") and influenza vacc need  Thyroid Problem Presents for initial visit. Symptoms include fatigue and heat intolerance. Patient reports no anxiety, cold intolerance, constipation, depressed mood, diaphoresis, diarrhea, dry skin, hair loss, hoarse voice, leg swelling, menstrual problem, nail problem, palpitations, tremors, visual change, weight gain or weight loss. (Ablation no period) The symptoms have been worsening.    Review of Systems  Constitutional: Positive for fatigue. Negative for chills, diaphoresis, fever, unexpected weight change, weight gain and weight loss.  HENT: Negative for congestion, ear discharge, ear pain, hoarse voice, postnasal drip, rhinorrhea, sinus pressure, sneezing and sore throat.   Eyes: Negative for photophobia, pain, discharge, redness and itching.  Respiratory: Negative for cough, chest tightness, shortness of breath, wheezing and stridor.   Cardiovascular: Negative for chest pain, palpitations and leg swelling.  Gastrointestinal: Negative for abdominal pain, blood in stool, constipation, diarrhea, nausea and vomiting.  Endocrine: Positive for heat intolerance, polydipsia and polyuria. Negative for cold intolerance and polyphagia.  Genitourinary: Negative for dysuria, flank pain, frequency, hematuria, menstrual problem, pelvic pain, urgency, vaginal bleeding and vaginal discharge.  Musculoskeletal: Negative for arthralgias, back pain, myalgias and neck stiffness.  Skin: Negative for rash.  Allergic/Immunologic: Negative for environmental allergies and food allergies.  Neurological: Negative for dizziness, tremors, weakness, light-headedness, numbness and headaches.  Hematological: Negative for adenopathy. Does not bruise/bleed easily.  Psychiatric/Behavioral: Negative for dysphoric mood. The  patient is not nervous/anxious.     Patient Active Problem List   Diagnosis Date Noted  . Uterine cramping 10/02/2016  . Chronic anxiety 10/02/2016  . Hypertension 10/02/2016    Allergies  Allergen Reactions  . Depakote [Valproic Acid] Nausea And Vomiting    Past Surgical History:  Procedure Laterality Date  . ABLATION  05/2015  . MEDIAL PARTIAL KNEE REPLACEMENT Left     Social History   Tobacco Use  . Smoking status: Never Smoker  . Smokeless tobacco: Never Used  Substance Use Topics  . Alcohol use: Yes    Comment: occasionally  . Drug use: No     Medication list has been reviewed and updated.  Current Meds  Medication Sig  . LORazepam (ATIVAN) 1 MG tablet Take 1 tablet by mouth daily.  . metoprolol succinate (TOPROL-XL) 100 MG 24 hr tablet Take 1 tablet (100 mg total) by mouth daily. Take with or immediately following a meal.  . naproxen (NAPROSYN) 500 MG tablet Take 1 tablet (500 mg total) by mouth 2 (two) times daily.  Marland Kitchen omeprazole (PRILOSEC) 40 MG capsule Take 40 mg by mouth 2 (two) times daily. otc  . oxyCODONE-acetaminophen (PERCOCET/ROXICET) 5-325 MG tablet Take 1 tablet by mouth every 8 (eight) hours as needed for severe pain. Do not drive while taking as can cause drowsiness.  . sertraline (ZOLOFT) 50 MG tablet Take 1 tablet by mouth daily.  . traZODone (DESYREL) 50 MG tablet Take 1 tablet by mouth 2 (two) times daily.  Marland Kitchen triamcinolone cream (KENALOG) 0.1 % Apply 1 application topically 2 (two) times daily.    PHQ 2/9 Scores 10/31/2018 05/26/2018 05/13/2018 10/31/2017  PHQ - 2 Score 0 0 0 0  PHQ- 9 Score 0 0 0 0    BP Readings from Last 3 Encounters:  10/31/18 130/88  10/29/18 (!) 130/95  05/26/18 100/60    Physical Exam Vitals signs  and nursing note reviewed.  Constitutional:      General: She is not in acute distress.    Appearance: She is obese. She is not diaphoretic.  HENT:     Head: Normocephalic and atraumatic.     Right Ear: Tympanic  membrane, ear canal and external ear normal.     Left Ear: Tympanic membrane, ear canal and external ear normal.     Nose: Nose normal. No congestion or rhinorrhea.  Eyes:     General:        Right eye: No discharge.        Left eye: No discharge.     Conjunctiva/sclera: Conjunctivae normal.     Pupils: Pupils are equal, round, and reactive to light.  Neck:     Musculoskeletal: Normal range of motion and neck supple.     Thyroid: No thyromegaly.     Vascular: No JVD.  Cardiovascular:     Rate and Rhythm: Normal rate and regular rhythm.     Heart sounds: Normal heart sounds. No murmur. No friction rub. No gallop.   Pulmonary:     Effort: Pulmonary effort is normal.     Breath sounds: Normal breath sounds.  Abdominal:     General: Bowel sounds are normal.     Palpations: Abdomen is soft. There is no mass.     Tenderness: There is no abdominal tenderness. There is no guarding.  Musculoskeletal: Normal range of motion.  Lymphadenopathy:     Cervical: No cervical adenopathy.  Skin:    General: Skin is warm and dry.     Capillary Refill: Capillary refill takes less than 2 seconds.  Neurological:     Mental Status: She is alert.     Deep Tendon Reflexes: Reflexes are normal and symmetric.     Wt Readings from Last 3 Encounters:  10/31/18 248 lb (112.5 kg)  10/29/18 247 lb (112 kg)  05/26/18 257 lb (116.6 kg)    BP 130/88   Pulse 76   Ht 5\' 8"  (1.727 m)   Wt 248 lb (112.5 kg)   BMI 37.71 kg/m   Assessment and Plan: 1. Fatigue, unspecified type Patient has a history over the past couple weeks of increasing fatigue.  We will initiate a work-up with CMP, A1c, thyroid panel with TSH, CBC, and LDH. - Comprehensive metabolic panel - Hemoglobin A1c - Thyroid Panel With TSH - CBC with Differential/Platelet - Lactate Dehydrogenase (LDH)  2. Night sweats Patient has had increasing night sweats and night along with polyuria and polydipsia.  Patient does not have periods because  of a history of ablation.  We will evaluate the possibility of menopause with a FSH LH. - Hemoglobin A1c - Lactate Dehydrogenase (LDH) - FSH/LH  3. Weight loss She was noted to have had weight loss of 10 pounds over the past 6 months.  Patient admits to trying to watch what she eats and some exercising which may account for this however we will make sure that there is nothing of concern that may be occurring. - Comprehensive metabolic panel - Hemoglobin A1c - Thyroid Panel With TSH - CBC with Differential/Platelet - Lactate Dehydrogenase (LDH)  4. Influenza vaccine needed Discussed and administered - Flu Vaccine QUAD 6+ mos PF IM (Fluarix Quad PF)

## 2018-11-01 LAB — COMPREHENSIVE METABOLIC PANEL
ALT: 26 IU/L (ref 0–32)
AST: 56 IU/L — ABNORMAL HIGH (ref 0–40)
Albumin/Globulin Ratio: 1.8 (ref 1.2–2.2)
Albumin: 4.2 g/dL (ref 3.8–4.8)
Alkaline Phosphatase: 85 IU/L (ref 39–117)
BUN/Creatinine Ratio: 20 (ref 9–23)
BUN: 12 mg/dL (ref 6–24)
Bilirubin Total: 0.3 mg/dL (ref 0.0–1.2)
CO2: 26 mmol/L (ref 20–29)
Calcium: 9.6 mg/dL (ref 8.7–10.2)
Chloride: 104 mmol/L (ref 96–106)
Creatinine, Ser: 0.61 mg/dL (ref 0.57–1.00)
GFR calc Af Amer: 124 mL/min/{1.73_m2} (ref >59)
GFR calc non Af Amer: 108 mL/min/{1.73_m2} (ref >59)
Globulin, Total: 2.4 g/dL (ref 1.5–4.5)
Glucose: 88 mg/dL (ref 65–99)
Potassium: 5.2 mmol/L (ref 3.5–5.2)
Sodium: 143 mmol/L (ref 134–144)
Total Protein: 6.6 g/dL (ref 6.0–8.5)

## 2018-11-01 LAB — CBC WITH DIFFERENTIAL/PLATELET
Basophils Absolute: 0.1 10*3/uL (ref 0.0–0.2)
Basos: 2 %
EOS (ABSOLUTE): 0.2 10*3/uL (ref 0.0–0.4)
Eos: 6 %
Hematocrit: 38.5 % (ref 34.0–46.6)
Hemoglobin: 12.1 g/dL (ref 11.1–15.9)
Immature Grans (Abs): 0 10*3/uL (ref 0.0–0.1)
Immature Granulocytes: 0 %
Lymphocytes Absolute: 1.5 10*3/uL (ref 0.7–3.1)
Lymphs: 36 %
MCH: 27.2 pg (ref 26.6–33.0)
MCHC: 31.4 g/dL — ABNORMAL LOW (ref 31.5–35.7)
MCV: 87 fL (ref 79–97)
Monocytes Absolute: 0.4 10*3/uL (ref 0.1–0.9)
Monocytes: 8 %
Neutrophils Absolute: 2.1 10*3/uL (ref 1.4–7.0)
Neutrophils: 48 %
Platelets: 258 10*3/uL (ref 150–450)
RBC: 4.45 x10E6/uL (ref 3.77–5.28)
RDW: 13.1 % (ref 11.7–15.4)
WBC: 4.3 10*3/uL (ref 3.4–10.8)

## 2018-11-01 LAB — HEMOGLOBIN A1C
Est. average glucose Bld gHb Est-mCnc: 103 mg/dL
Hgb A1c MFr Bld: 5.2 % (ref 4.8–5.6)

## 2018-11-01 LAB — THYROID PANEL WITH TSH
Free Thyroxine Index: 2.3 (ref 1.2–4.9)
T3 Uptake Ratio: 28 % (ref 24–39)
T4, Total: 8.2 ug/dL (ref 4.5–12.0)
TSH: 2.43 u[IU]/mL (ref 0.450–4.500)

## 2018-11-01 LAB — FSH/LH
FSH: 47.4 m[IU]/mL
LH: 43.2 m[IU]/mL

## 2018-11-01 LAB — LACTATE DEHYDROGENASE: LDH: 254 IU/L — ABNORMAL HIGH (ref 119–226)

## 2018-12-01 ENCOUNTER — Other Ambulatory Visit: Payer: Self-pay

## 2018-12-01 DIAGNOSIS — Z9889 Other specified postprocedural states: Secondary | ICD-10-CM

## 2018-12-01 MED ORDER — NAPROXEN 500 MG PO TABS: 500.0000 mg | ORAL_TABLET | Freq: Two times a day (BID) | ORAL | 0 refills | Status: DC

## 2019-02-05 ENCOUNTER — Encounter: Payer: Self-pay | Admitting: Obstetrics and Gynecology

## 2019-02-05 ENCOUNTER — Other Ambulatory Visit (HOSPITAL_COMMUNITY)
Admission: RE | Admit: 2019-02-05 | Discharge: 2019-02-05 | Disposition: A | Discharge status: Home / Self Care | Payer: BC Managed Care – PPO | Source: Ambulatory Visit | Attending: Obstetrics and Gynecology | Admitting: Obstetrics and Gynecology

## 2019-02-05 ENCOUNTER — Ambulatory Visit (INDEPENDENT_AMBULATORY_CARE_PROVIDER_SITE_OTHER): Payer: BC Managed Care – PPO | Admitting: Obstetrics and Gynecology

## 2019-02-05 ENCOUNTER — Other Ambulatory Visit: Payer: Self-pay

## 2019-02-05 VITALS — BP 125/83 | HR 86 | Ht 68.0 in | Wt 249.0 lb

## 2019-02-05 DIAGNOSIS — Z1151 Encounter for screening for human papillomavirus (HPV): Secondary | ICD-10-CM | POA: Insufficient documentation

## 2019-02-05 DIAGNOSIS — Z113 Encounter for screening for infections with a predominantly sexual mode of transmission: Secondary | ICD-10-CM | POA: Insufficient documentation

## 2019-02-05 DIAGNOSIS — Z124 Encounter for screening for malignant neoplasm of cervix: Secondary | ICD-10-CM

## 2019-02-05 DIAGNOSIS — R87610 Atypical squamous cells of undetermined significance on cytologic smear of cervix (ASC-US): Secondary | ICD-10-CM | POA: Insufficient documentation

## 2019-02-05 DIAGNOSIS — R87810 Cervical high risk human papillomavirus (HPV) DNA test positive: Secondary | ICD-10-CM | POA: Diagnosis not present

## 2019-02-05 DIAGNOSIS — Z01419 Encounter for gynecological examination (general) (routine) without abnormal findings: Secondary | ICD-10-CM

## 2019-02-05 DIAGNOSIS — Z01411 Encounter for gynecological examination (general) (routine) with abnormal findings: Secondary | ICD-10-CM | POA: Insufficient documentation

## 2019-02-05 DIAGNOSIS — Z1331 Encounter for screening for depression: Secondary | ICD-10-CM

## 2019-02-05 DIAGNOSIS — Z1339 Encounter for screening examination for other mental health and behavioral disorders: Secondary | ICD-10-CM

## 2019-02-05 DIAGNOSIS — N951 Menopausal and female climacteric states: Secondary | ICD-10-CM

## 2019-02-05 MED ORDER — ESTROGENS, CONJUGATED 0.625 MG/GM VA CREA: 1.0000 | TOPICAL_CREAM | VAGINAL | 3 refills | Status: AC

## 2019-02-05 NOTE — Progress Notes (Signed)
Gynecology Annual Exam  PCP: Duanne Limerick, MD  Chief Complaint  Patient presents with  . Gynecologic Exam  . vasomotor symptoms    Hot flashes, vaginal dryness, painful intercourse   History of Present Illness:  Laura Christian is a 49 y.o. [id number] who LMP was No LMP recorded. Patient has had an ablation., presents today for her annual examination.  Her menses are absent due to an ablation.  She has had breakthrough spotting occasionally. This has been 8-9 months ago.   She is sexually active. She has vaginal dryness and this is uncomfortable for her.  She has tried Replens and this didn't work. She states she try this consistently.  Last Pap: 10/02/2016  Results were: no abnormalities /neg HPV DNA negative Hx of STDs: none  Last mammogram:  About 4 years, this was normal.  There is no FH of breast cancer. There is a FH of ovarian cancer in her paternal grandmother. The patient does do self-breast exams.  Tobacco use: The patient denies current or previous tobacco use. Alcohol use: social drinker Exercise: moderately active. She goes to the gym 3-4 times her week.  The patient wears seatbelts: no.      When she went to her PCP in October she had been having hot flashes and moodiness.  She also has "extreme dryness."   She also reports symptoms of urinary urgency without urge urinary incontinence.    Past Medical History:  Diagnosis Date  . Anxiety   . Depression   . Family history of ovarian cancer    10/18 cancer genetic testing letter sent  . GERD (gastroesophageal reflux disease)   . Hypertension   . Insomnia     Past Surgical History:  Procedure Laterality Date  . ABLATION  05/2015  . MEDIAL PARTIAL KNEE REPLACEMENT Left     Prior to Admission medications   Medication Sig Start Date End Date Taking? Authorizing Provider  LORazepam (ATIVAN) 1 MG tablet Take 1 tablet by mouth daily. 07/21/15  Yes [provider]  metoprolol succinate (TOPROL-XL) 100 MG  24 hr tablet Take 1 tablet (100 mg total) by mouth daily. Take with or immediately following a meal. 05/13/18  Yes Duanne Limerick, MD  naproxen (NAPROSYN) 500 MG tablet Take 1 tablet (500 mg total) by mouth 2 (two) times daily. 12/01/18  Yes Duanne Limerick, MD  omeprazole (PRILOSEC) 40 MG capsule Take 40 mg by mouth 2 (two) times daily. otc   Yes [provider]  sertraline (ZOLOFT) 50 MG tablet Take 1 tablet by mouth daily. 11/24/15  Yes [provider]  traZODone (DESYREL) 50 MG tablet Take 1 tablet by mouth 2 (two) times daily. 11/24/15  Yes [provider]  triamcinolone cream (KENALOG) 0.1 % Apply 1 application topically 2 (two) times daily. 02/17/16  Yes Duanne Limerick, MD    Allergies  Allergen Reactions  . Depakote [Valproic Acid] Nausea And Vomiting    Gynecologic History: No LMP recorded. Patient has had an ablation.  Obstetric History: [id number], s/p SVD x 4  Social History   Socioeconomic History  . Marital status: Single    Spouse name: Not on file  . Number of children: Not on file  . Years of education: Not on file  . Highest education level: Not on file  Occupational History  . Not on file  Tobacco Use  . Smoking status: Never Smoker  . Smokeless tobacco: Never Used  Substance and Sexual Activity  .  Alcohol use: Yes    Comment: occasionally  . Drug use: No  . Sexual activity: Yes    Birth control/protection: None, Other-see comments    Comment: ablation  Other Topics Concern  . Not on file  Social History Narrative  . Not on file   Social Determinants of Health   Financial Resource Strain:   . Difficulty of Paying Living Expenses: Not on file  Food Insecurity:   . Worried About Programme researcher, broadcasting/film/video in the Last Year: Not on file  . Ran Out of Food in the Last Year: Not on file  Transportation Needs:   . Lack of Transportation (Medical): Not on file  . Lack of Transportation (Non-Medical): Not on file  Physical Activity:   .  Days of Exercise per Week: Not on file  . Minutes of Exercise per Session: Not on file  Stress:   . Feeling of Stress : Not on file  Social Connections:   . Frequency of Communication with Friends and Family: Not on file  . Frequency of Social Gatherings with Friends and Family: Not on file  . Attends Religious Services: Not on file  . Active Member of Clubs or Organizations: Not on file  . Attends Banker Meetings: Not on file  . Marital Status: Not on file  Intimate Partner Violence:   . Fear of Current or Ex-Partner: Not on file  . Emotionally Abused: Not on file  . Physically Abused: Not on file  . Sexually Abused: Not on file    Family History  Problem Relation Age of Onset  . Ovarian cancer Maternal Grandmother 59  . Lung cancer Paternal Grandmother 21  . Diabetes Neg Hx   . Hypertension Neg Hx   . Stroke Neg Hx   . Thyroid disease Neg Hx     Review of Systems  Constitutional: Negative.        Hot flashes  HENT: Negative.   Eyes: Negative.   Respiratory: Negative.   Cardiovascular: Negative.   Gastrointestinal: Negative.   Genitourinary: Negative.        See HPI, Difficulty holding urine  Musculoskeletal: Positive for joint pain (joint swelling, also). Negative for back pain, falls, myalgias and neck pain.  Skin: Negative.   Neurological: Negative.   Endo/Heme/Allergies: Positive for environmental allergies. Negative for polydipsia. Does not bruise/bleed easily.  Psychiatric/Behavioral: Negative for depression, hallucinations, memory loss, substance abuse and suicidal ideas. The patient is nervous/anxious. The patient does not have insomnia.      Physical Exam BP 125/83 (BP Location: Left Arm, Patient Position: Sitting, Cuff Size: Large)   Pulse 86   Ht 5\' 8"  (1.727 m)   Wt 249 lb (112.9 kg)   BMI 37.86 kg/m    Physical Exam Constitutional:      General: She is not in acute distress.    Appearance: Normal appearance. She is well-developed.   Genitourinary:     Pelvic exam was performed with patient supine.     Vulva, urethra, bladder and uterus normal.     No inguinal adenopathy present in the right or left side.    No signs of injury in the vagina.     No vaginal discharge, erythema, tenderness or bleeding.     No cervical motion tenderness, discharge, lesion or polyp.     Uterus is mobile.     Uterus is not enlarged or tender.     No uterine mass detected.    Uterus is anteverted.  No right or left adnexal mass present.     Right adnexa not tender or full.     Left adnexa not tender or full.  HENT:     Head: Normocephalic and atraumatic.  Eyes:     General: No scleral icterus.    Conjunctiva/sclera: Conjunctivae normal.  Neck:     Thyroid: No thyromegaly.  Cardiovascular:     Rate and Rhythm: Normal rate and regular rhythm.     Heart sounds: No murmur. No friction rub. No gallop.   Pulmonary:     Effort: Pulmonary effort is normal. No respiratory distress.     Breath sounds: Normal breath sounds. No wheezing or rales.  Chest:     Breasts:        Right: No inverted nipple, mass, nipple discharge, skin change or tenderness.        Left: No inverted nipple, mass, nipple discharge, skin change or tenderness.  Abdominal:     General: Bowel sounds are normal. There is no distension.     Palpations: Abdomen is soft. There is no mass.     Tenderness: There is no abdominal tenderness. There is no guarding or rebound.  Musculoskeletal:        General: No swelling or tenderness. Normal range of motion.     Cervical back: Normal range of motion and neck supple.  Lymphadenopathy:     Cervical: No cervical adenopathy.     Lower Body: No right inguinal adenopathy. No left inguinal adenopathy.  Neurological:     General: No focal deficit present.     Mental Status: She is alert and oriented to person, place, and time.     Cranial Nerves: No cranial nerve deficit.  Skin:    General: Skin is warm and dry.      Findings: No erythema or rash.  Psychiatric:        Mood and Affect: Mood normal.        Behavior: Behavior normal.        Judgment: Judgment normal.     Female chaperone present for pelvic and breast  portions of the physical exam  Results: AUDIT Questionnaire (screen for alcoholism): 2 PHQ-9: 0  Assessment: 49 y.o. [id number] female here for routine annual gynecologic examination.  Plan: Problem List Items Addressed This Visit    None    Visit Diagnoses    Women's annual routine gynecological examination    -  Primary   Relevant Medications   conjugated estrogens (PREMARIN) vaginal cream   Other Relevant Orders   Cytology - PAP   Follicle stimulating hormone   Screening for depression       Screening for alcoholism       Screen for STD (sexually transmitted disease)       Relevant Orders   Cytology - PAP   Pap smear for cervical cancer screening       Relevant Orders   Cytology - PAP   Climacteric       Relevant Medications   conjugated estrogens (PREMARIN) vaginal cream   Other Relevant Orders   Follicle stimulating hormone      Screening: -- Blood pressure screen managed by PCP -- Colonoscopy - not due -- Mammogram - due. Patient to call [facility] to arrange. She understands that it is her responsibility to arrange this. -- Weight screening: obese: discussed management options, including lifestyle, dietary, and exercise. -- Depression screening negative (PHQ-9) -- Nutrition: normal -- cholesterol screening: per PCP -- osteoporosis screening:  not due -- tobacco screening: not using -- alcohol screening: AUDIT questionnaire indicates low-risk usage. -- family history of breast cancer screening: done. not at high risk. -- no evidence of domestic violence or intimate partner violence. -- STD screening: gonorrhea/chlamydia NAAT collected -- pap smear collected per ASCCP guidelines -- flu vaccine received this year already -- HPV vaccination series: not eligilbe    Climacteric: Will start by treating with topical estrogen.  I am hesitant to use systemic estrogen in the setting of hypertension.  So, will continue to monitor hot flashes. If not treated by topic estrogen, may consider alternative medications to estrogen for relief of symptoms.  Will check [place] today.   Prentice Docker, MD 02/05/2019 11:50 AM

## 2019-02-06 LAB — FOLLICLE STIMULATING HORMONE: FSH: 51 m[IU]/mL

## 2019-02-12 LAB — CYTOLOGY - PAP
Chlamydia: NEGATIVE
Comment: NEGATIVE
Comment: NEGATIVE
Comment: NEGATIVE
Comment: NEGATIVE
Comment: NEGATIVE
Comment: NORMAL
Diagnosis: UNDETERMINED — AB
HPV 16: NEGATIVE
HPV 18 / 45: POSITIVE — AB
High risk HPV: POSITIVE — AB
Neisseria Gonorrhea: NEGATIVE
Trichomonas: NEGATIVE

## 2019-02-13 ENCOUNTER — Telehealth: Payer: Self-pay

## 2019-02-13 NOTE — Telephone Encounter (Signed)
Please let me know when pt returns my call. LM with pt regarding her pap smear results. She will need to schedule a colpo.

## 2019-02-17 NOTE — Progress Notes (Signed)
Did you get in touch with this patient?

## 2019-02-20 ENCOUNTER — Encounter: Payer: Self-pay | Admitting: Obstetrics and Gynecology

## 2019-02-20 ENCOUNTER — Telehealth: Payer: Self-pay | Admitting: Obstetrics and Gynecology

## 2019-02-20 NOTE — Telephone Encounter (Signed)
Unable to leave VM due to full voicemail.

## 2019-02-23 NOTE — Telephone Encounter (Signed)
Letter was sent to pt 

## 2019-03-20 ENCOUNTER — Encounter: Payer: Self-pay | Admitting: Family Medicine

## 2019-03-20 ENCOUNTER — Ambulatory Visit (INDEPENDENT_AMBULATORY_CARE_PROVIDER_SITE_OTHER): Payer: BC Managed Care – PPO | Admitting: Family Medicine

## 2019-03-20 ENCOUNTER — Other Ambulatory Visit: Payer: Self-pay

## 2019-03-20 VITALS — BP 112/68 | HR 82 | Ht 68.0 in | Wt 246.0 lb

## 2019-03-20 DIAGNOSIS — Z9889 Other specified postprocedural states: Secondary | ICD-10-CM | POA: Diagnosis not present

## 2019-03-20 DIAGNOSIS — M1732 Unilateral post-traumatic osteoarthritis, left knee: Secondary | ICD-10-CM | POA: Diagnosis not present

## 2019-03-20 DIAGNOSIS — I10 Essential (primary) hypertension: Secondary | ICD-10-CM

## 2019-03-20 DIAGNOSIS — M5431 Sciatica, right side: Secondary | ICD-10-CM

## 2019-03-20 MED ORDER — GABAPENTIN 100 MG PO CAPS: 100.0000 mg | ORAL_CAPSULE | Freq: Two times a day (BID) | ORAL | 1 refills | Status: DC

## 2019-03-20 MED ORDER — METOPROLOL SUCCINATE ER 100 MG PO TB24: 100.0000 mg | ORAL_TABLET | Freq: Every day | ORAL | 1 refills | Status: DC

## 2019-03-20 MED ORDER — NAPROXEN 500 MG PO TABS: 500.0000 mg | ORAL_TABLET | Freq: Two times a day (BID) | ORAL | 0 refills | Status: DC
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
## 2019-03-26 ENCOUNTER — Encounter: Payer: Self-pay | Admitting: Emergency Medicine

## 2019-03-26 ENCOUNTER — Other Ambulatory Visit: Payer: Self-pay

## 2019-03-26 ENCOUNTER — Ambulatory Visit
Admission: EM | Admit: 2019-03-26 | Discharge: 2019-03-26 | Disposition: A | Discharge status: Home / Self Care | Payer: BC Managed Care – PPO | Attending: Family Medicine | Admitting: Family Medicine

## 2019-03-26 DIAGNOSIS — R1031 Right lower quadrant pain: Secondary | ICD-10-CM

## 2019-03-26 DIAGNOSIS — N12 Tubulo-interstitial nephritis, not specified as acute or chronic: Secondary | ICD-10-CM | POA: Diagnosis not present

## 2019-03-26 DIAGNOSIS — M545 Low back pain: Secondary | ICD-10-CM | POA: Diagnosis not present

## 2019-03-26 LAB — URINALYSIS, COMPLETE (UACMP) WITH MICROSCOPIC
Bilirubin Urine: NEGATIVE
Glucose, UA: NEGATIVE mg/dL
Hgb urine dipstick: NEGATIVE
Ketones, ur: NEGATIVE mg/dL
Nitrite: POSITIVE — AB
Protein, ur: NEGATIVE mg/dL
RBC / HPF: NONE SEEN RBC/hpf (ref 0–5)
Specific Gravity, Urine: 1.01 (ref 1.005–1.030)
pH: 6 (ref 5.0–8.0)

## 2019-03-26 MED ORDER — HYDROCODONE-ACETAMINOPHEN 5-325 MG PO TABS: 1.0000 | ORAL_TABLET | Freq: Three times a day (TID) | ORAL | 0 refills | Status: DC | PRN

## 2019-03-26 MED ORDER — CIPROFLOXACIN HCL 500 MG PO TABS: 500.0000 mg | ORAL_TABLET | Freq: Two times a day (BID) | ORAL | 0 refills | Status: DC

## 2019-03-26 NOTE — Discharge Instructions (Signed)
Medication as prescribed. ° °If you worsen, please go to the ER. ° °Take care ° °Dr. Anedra Penafiel  °

## 2019-03-26 NOTE — ED Provider Notes (Signed)
[facility]    CSN: [id number] Arrival date & time: 03/26/19  1140   History   Chief Complaint Chief Complaint  Patient presents with  . Abdominal Pain   HPI  49 year old female presents with abdominal pain and back pain.  4-day history of right-sided abdominal pain/flank pain which radiates to the back.  Pain is constant.  Moderate to severe.  Currently 6/10 in severity.  Described as aching.  Denies nausea, vomiting, diarrhea.  No fever.  Denies urinary symptoms.  No hematuria.  No relieving factors.  Patient states that she feels very poorly.  No other reported symptoms.  No other complaints or concerns at this time.  Past Medical History:  Diagnosis Date  . Anxiety   . Depression   . Family history of ovarian cancer    10/18 cancer genetic testing letter sent  . GERD (gastroesophageal reflux disease)   . Hypertension   . Insomnia     Patient Active Problem List   Diagnosis Date Noted  . Uterine cramping 10/02/2016  . Chronic anxiety 10/02/2016  . Hypertension 10/02/2016    Past Surgical History:  Procedure Laterality Date  . ABLATION  05/2015  . MEDIAL PARTIAL KNEE REPLACEMENT Left     OB History    Gravida  4   Para  4   Term  4   Preterm      AB      Living  4     SAB      TAB      Ectopic      Multiple      Live Births  4            Home Medications    Prior to Admission medications   Medication Sig Start Date End Date Taking? Authorizing Provider  conjugated estrogens (PREMARIN) vaginal cream Place 1 Applicatorful vaginally 2 (two) times a week. 1 gram vaginally at bedtime twice weekly 02/05/19  Yes Conard Novak, MD  gabapentin (NEURONTIN) 100 MG capsule Take 1 capsule (100 mg total) by mouth 2 (two) times daily. 03/20/19  Yes Duanne Limerick, MD  LORazepam (ATIVAN) 1 MG tablet Take 1 tablet by mouth daily. 07/21/15  Yes [provider]  metoprolol succinate (TOPROL-XL) 100 MG 24 hr tablet Take 1 tablet (100  mg total) by mouth daily. Take with or immediately following a meal. 03/20/19  Yes Duanne Limerick, MD  naproxen (NAPROSYN) 500 MG tablet Take 1 tablet (500 mg total) by mouth 2 (two) times daily. 03/20/19  Yes Duanne Limerick, MD  omeprazole (PRILOSEC) 40 MG capsule Take 40 mg by mouth 2 (two) times daily. otc   Yes [provider]  sertraline (ZOLOFT) 50 MG tablet Take 1 tablet by mouth daily. 11/24/15  Yes [provider]  traZODone (DESYREL) 50 MG tablet Take 1 tablet by mouth 2 (two) times daily. 11/24/15  Yes [provider]  triamcinolone cream (KENALOG) 0.1 % Apply 1 application topically 2 (two) times daily. 02/17/16  Yes Duanne Limerick, MD  ciprofloxacin (CIPRO) 500 MG tablet Take 1 tablet (500 mg total) by mouth 2 (two) times daily. 03/26/19   Tommie Sams, DO  HYDROcodone-acetaminophen (NORCO/VICODIN) 5-325 MG tablet Take 1 tablet by mouth every 8 (eight) hours as needed. 03/26/19   Tommie Sams, DO    Family History Family History  Problem Relation Age of Onset  . Ovarian cancer Maternal Grandmother 56  . Lung cancer Paternal Grandmother 56  .  Diabetes Neg Hx   . Hypertension Neg Hx   . Stroke Neg Hx   . Thyroid disease Neg Hx     Social History Social History   Tobacco Use  . Smoking status: Never Smoker  . Smokeless tobacco: Never Used  Substance Use Topics  . Alcohol use: Yes    Comment: occasionally  . Drug use: No     Allergies   Depakote [valproic acid]   Review of Systems Review of Systems  Constitutional: Negative for fever.  Gastrointestinal: Positive for abdominal pain. Negative for diarrhea, nausea and vomiting.  Genitourinary: Positive for flank pain.  Musculoskeletal: Positive for back pain.   Physical Exam Triage Vital Signs ED Triage Vitals  Enc Vitals Group     BP 03/26/19 1154 124/75     Pulse Rate 03/26/19 1154 85     Resp 03/26/19 1154 18     Temp 03/26/19 1154 98.5 F (36.9 C)     Temp Source 03/26/19 1154 Oral      SpO2 03/26/19 1154 100 %     Weight 03/26/19 1155 240 lb (108.9 kg)     Height 03/26/19 1155 5\' 7"  (1.702 m)     Head Circumference --      Peak Flow --      Pain Score 03/26/19 1155 6     Pain Loc --      Pain Edu? --      Excl. in GC? --    Updated Vital Signs BP 124/75 (BP Location: Right Arm)   Pulse 85   Temp 98.5 F (36.9 C) (Oral)   Resp 18   Ht 5\' 7"  (1.702 m)   Wt 108.9 kg   SpO2 100%   BMI 37.59 kg/m   Visual Acuity Right Eye Distance:   Left Eye Distance:   Bilateral Distance:    Right Eye Near:   Left Eye Near:    Bilateral Near:     Physical Exam Vitals and nursing note reviewed.  Constitutional:      General: She is not in acute distress.    Appearance: Normal appearance. She is well-developed. She is obese. She is not ill-appearing.  HENT:     Head: Normocephalic and atraumatic.  Eyes:     General:        Right eye: No discharge.        Left eye: No discharge.     Conjunctiva/sclera: Conjunctivae normal.  Cardiovascular:     Rate and Rhythm: Normal rate and regular rhythm.     Heart sounds: No murmur.  Pulmonary:     Effort: Pulmonary effort is normal.     Breath sounds: Normal breath sounds. No wheezing, rhonchi or rales.  Abdominal:     General: There is no distension.     Palpations: Abdomen is soft.     Comments: Tenderness to palpation to the right of the umbilicus near the flank.  Positive CVA tenderness on the right.  Neurological:     Mental Status: She is alert.  Psychiatric:        Mood and Affect: Mood normal.        Behavior: Behavior normal.    UC Treatments / Results  Labs (all labs ordered are listed, but only abnormal results are displayed) Labs Reviewed  URINALYSIS, COMPLETE (UACMP) WITH MICROSCOPIC - Abnormal; Notable for the following components:      Result Value   Color, Urine STRAW (*)    APPearance HAZY (*)  Nitrite POSITIVE (*)    Leukocytes,Ua MODERATE (*)    Bacteria, UA MANY (*)    All other  components within normal limits  URINE CULTURE    EKG   Radiology No results found.  Procedures Procedures (including critical care time)  Medications Ordered in UC Medications - No data to display  Initial Impression / Assessment and Plan / UC Course  I have reviewed the triage vital signs and the nursing notes.  Pertinent labs & imaging results that were available during my care of the patient were reviewed by me and considered in my medical decision making (see chart for details).    49 year old female presents with suspected pyelonephritis.  Sending urine culture.  Placing on Cipro.  Hydrocodone as needed for pain.  Advised to go to the hospital if she fails to improve or worsens.  Final Clinical Impressions(s) / UC Diagnoses   Final diagnoses:  Pyelonephritis     Discharge Instructions     Medication as prescribed.  If you worsen, please go to the ER.  Take care  Dr. Lacinda Axon    ED Prescriptions    Medication Sig Dispense Auth. Provider   ciprofloxacin (CIPRO) 500 MG tablet Take 1 tablet (500 mg total) by mouth 2 (two) times daily. 20 tablet Gina Costilla G, DO   HYDROcodone-acetaminophen (NORCO/VICODIN) 5-325 MG tablet Take 1 tablet by mouth every 8 (eight) hours as needed. 10 tablet Thersa Salt G, DO     I have reviewed the PDMP during this encounter.   Coral Spikes, [place] 03/26/19 1338

## 2019-03-26 NOTE — ED Triage Notes (Signed)
Patient c/o right side abd pain that goes around to her back area that started 4 days ago. She states the pain is constant. She denies N/V/D. Denies fever.

## 2019-03-28 LAB — URINE CULTURE
Culture: >=100000 — AB
Special Requests: NORMAL

## 2019-04-01 ENCOUNTER — Encounter (HOSPITAL_COMMUNITY): Payer: Self-pay

## 2019-04-17 ENCOUNTER — Other Ambulatory Visit: Payer: Self-pay

## 2019-04-17 ENCOUNTER — Ambulatory Visit
Admission: EM | Admit: 2019-04-17 | Discharge: 2019-04-17 | Disposition: A | Discharge status: Home / Self Care | Payer: BC Managed Care – PPO | Attending: Family Medicine | Admitting: Family Medicine

## 2019-04-17 ENCOUNTER — Encounter: Payer: Self-pay | Admitting: Emergency Medicine

## 2019-04-17 DIAGNOSIS — R109 Unspecified abdominal pain: Secondary | ICD-10-CM

## 2019-04-17 DIAGNOSIS — R350 Frequency of micturition: Secondary | ICD-10-CM

## 2019-04-17 LAB — URINALYSIS, COMPLETE (UACMP) WITH MICROSCOPIC
Bacteria, UA: NONE SEEN
Bilirubin Urine: NEGATIVE
Glucose, UA: NEGATIVE mg/dL
Ketones, ur: NEGATIVE mg/dL
Nitrite: NEGATIVE
Protein, ur: NEGATIVE mg/dL
Specific Gravity, Urine: 1.01 (ref 1.005–1.030)
pH: 6 (ref 5.0–8.0)

## 2019-04-17 LAB — WET PREP, GENITAL
Clue Cells Wet Prep HPF POC: NONE SEEN
Sperm: NONE SEEN
Trich, Wet Prep: NONE SEEN
Yeast Wet Prep HPF POC: NONE SEEN

## 2019-04-17 MED ORDER — HYDROCODONE-ACETAMINOPHEN 5-325 MG PO TABS: 1.0000 | ORAL_TABLET | Freq: Three times a day (TID) | ORAL | 0 refills | Status: DC | PRN

## 2019-04-17 MED ORDER — TAMSULOSIN HCL 0.4 MG PO CAPS: 0.4000 mg | ORAL_CAPSULE | Freq: Every day | ORAL | 0 refills | Status: DC

## 2019-04-17 NOTE — Discharge Instructions (Addendum)
Medication as prescribed.  If you worsen, go to the ER.  I will place a referral to Urology should be in touch. Call on Monday [facility] [phone]  Take care  Dr. Adriana Simas

## 2019-04-17 NOTE — ED Provider Notes (Signed)
[facility]    CSN: [id number] Arrival date & time: 04/17/19  1215      History   Chief Complaint Chief Complaint  Patient presents with  . Flank Pain  . Urinary Frequency   HPI  49 year old female presents with the above complaints.  Patient reports ongoing right flank pain.  She was seen previously by me on 3/11.  Diagnosed with suspected pyelonephritis.  Placed on Cipro.  Urine culture revealed greater than 100,000 colony-forming units of E. coli.  E. coli was pansensitive.  Patient endorses compliance and completion of antibiotic therapy.  She states that her flank pain improved some but has never fully resolved.  She reports that 2 to 3 days ago she developed urinary symptoms.  She did not have urinary symptoms previously.  She reports urinary frequency.  No reports of hematuria.  No fever.  Rates her pain a 6/10 in severity.  No other reported symptoms.  No other complaints.  Past Medical History:  Diagnosis Date  . Anxiety   . Depression   . Family history of ovarian cancer    10/18 cancer genetic testing letter sent  . GERD (gastroesophageal reflux disease)   . Hypertension   . Insomnia     Patient Active Problem List   Diagnosis Date Noted  . Uterine cramping 10/02/2016  . Chronic anxiety 10/02/2016  . Hypertension 10/02/2016    Past Surgical History:  Procedure Laterality Date  . ABLATION  05/2015  . MEDIAL PARTIAL KNEE REPLACEMENT Left     OB History    Gravida  4   Para  4   Term  4   Preterm      AB      Living  4     SAB      TAB      Ectopic      Multiple      Live Births  4            Home Medications    Prior to Admission medications   Medication Sig Start Date End Date Taking? Authorizing Provider  Calcium Carbonate (CALTRATE 600 PO) Take 1 tablet by mouth daily.   Yes [provider]  conjugated estrogens (PREMARIN) vaginal cream Place 1 Applicatorful vaginally 2 (two) times a week. 1 gram  vaginally at bedtime twice weekly 02/05/19  Yes Will Bonnet, MD  Cranberry 1000 MG CAPS Take 1 capsule by mouth daily.   Yes [provider]  gabapentin (NEURONTIN) 100 MG capsule Take 1 capsule (100 mg total) by mouth 2 (two) times daily. 03/20/19  Yes Juline Patch, MD  LORazepam (ATIVAN) 1 MG tablet Take 1 tablet by mouth daily. 07/21/15  Yes [provider]  metoprolol succinate (TOPROL-XL) 100 MG 24 hr tablet Take 1 tablet (100 mg total) by mouth daily. Take with or immediately following a meal. 03/20/19  Yes Juline Patch, MD  naproxen (NAPROSYN) 500 MG tablet Take 1 tablet (500 mg total) by mouth 2 (two) times daily. 03/20/19  Yes Juline Patch, MD  omeprazole (PRILOSEC) 40 MG capsule Take 40 mg by mouth 2 (two) times daily. otc   Yes [provider]  sertraline (ZOLOFT) 50 MG tablet Take 1 tablet by mouth daily. 11/24/15  Yes [provider]  traZODone (DESYREL) 50 MG tablet Take 1 tablet by mouth 2 (two) times daily. 11/24/15  Yes [provider]  triamcinolone cream (KENALOG) 0.1 % Apply 1 application topically 2 (two)  times daily. 02/17/16  Yes Duanne Limerick, MD  HYDROcodone-acetaminophen (NORCO/VICODIN) 5-325 MG tablet Take 1 tablet by mouth every 8 (eight) hours as needed for moderate pain or severe pain. 04/17/19   Tommie Sams, DO  tamsulosin (FLOMAX) 0.4 MG CAPS capsule Take 1 capsule (0.4 mg total) by mouth daily. 04/17/19   Tommie Sams, DO    Family History Family History  Problem Relation Age of Onset  . Ovarian cancer Maternal Grandmother 77  . Lung cancer Paternal Grandmother 75  . Alzheimer's disease Mother   . Other Mother        complications of covid  . Healthy Father   . Diabetes Neg Hx   . Hypertension Neg Hx   . Stroke Neg Hx   . Thyroid disease Neg Hx     Social History Social History   Tobacco Use  . Smoking status: Never Smoker  . Smokeless tobacco: Never Used  Substance Use Topics  . Alcohol use: Yes     Comment: occasionally  . Drug use: No     Allergies   Depakote [valproic acid]   Review of Systems Review of Systems  Constitutional: Negative for fever.  Genitourinary: Positive for flank pain and frequency.   Physical Exam Triage Vital Signs ED Triage Vitals [04/17/19 1234]  Enc Vitals Group     BP 126/83     Pulse Rate 76     Resp 18     Temp 98.1 F (36.7 C)     Temp Source Oral     SpO2 100 %     Weight 247 lb (112 kg)     Height 5\' 7"  (1.702 m)     Head Circumference      Peak Flow      Pain Score 6     Pain Loc      Pain Edu?      Excl. in GC?    Updated Vital Signs BP 126/83 (BP Location: Left Arm)   Pulse 76   Temp 98.1 F (36.7 C) (Oral)   Resp 18   Ht 5\' 7"  (1.702 m)   Wt 112 kg   SpO2 100%   BMI 38.69 kg/m   Visual Acuity Right Eye Distance:   Left Eye Distance:   Bilateral Distance:    Right Eye Near:   Left Eye Near:    Bilateral Near:     Physical Exam Vitals and nursing note reviewed.  Constitutional:      General: She is not in acute distress.    Appearance: Normal appearance. She is not ill-appearing.  HENT:     Head: Normocephalic and atraumatic.  Eyes:     General:        Right eye: No discharge.     Conjunctiva/sclera: Conjunctivae normal.  Cardiovascular:     Rate and Rhythm: Normal rate and regular rhythm.  Pulmonary:     Effort: Pulmonary effort is normal. No respiratory distress.     Breath sounds: Normal breath sounds.  Abdominal:     General: There is no distension.     Palpations: Abdomen is soft.     Tenderness: There is right CVA tenderness.     Comments: Mild tenderness on the right side of the abdomen, near the right flank.  Neurological:     Mental Status: She is alert.  Psychiatric:        Mood and Affect: Mood normal.        Behavior: Behavior  normal.    UC Treatments / Results  Labs (all labs ordered are listed, but only abnormal results are displayed) Labs Reviewed  WET PREP, GENITAL -  Abnormal; Notable for the following components:      Result Value   WBC, Wet Prep HPF POC MODERATE (*)    All other components within normal limits  URINALYSIS, COMPLETE (UACMP) WITH MICROSCOPIC - Abnormal; Notable for the following components:   Hgb urine dipstick TRACE (*)    Leukocytes,Ua TRACE (*)    All other components within normal limits    EKG   Radiology No results found.  Procedures Procedures (including critical care time)  Medications Ordered in UC Medications - No data to display  Initial Impression / Assessment and Plan / UC Course  I have reviewed the triage vital signs and the nursing notes.  Pertinent labs & imaging results that were available during my care of the patient were reviewed by me and considered in my medical decision making (see chart for details).    49 year old female presents with flank pain and urinary frequency.  Urinalysis not consistent with UTI.  No hematuria.  Discussed the possibility of kidney stone.  Offered imaging and patient elected to wait at this time.  Treating empirically for kidney stone with pain medication, Flomax.  Advised to strain urine.  Referral placed to [facility] urology.  Called the office today but they were closed due to it being Good Friday.  Patient is to call on Monday.  Final Clinical Impressions(s) / UC Diagnoses   Final diagnoses:  Flank pain  Urinary frequency     Discharge Instructions     Medication as prescribed.  If you worsen, go to the ER.  I will place a referral to Urology should be in touch. Call on Monday [facility] [phone]  Take care  Dr. Adriana Simas    ED Prescriptions    Medication Sig Dispense Auth. Provider   HYDROcodone-acetaminophen (NORCO/VICODIN) 5-325 MG tablet Take 1 tablet by mouth every 8 (eight) hours as needed for moderate pain or severe pain. 10 tablet Abdur Hoglund G, DO   tamsulosin (FLOMAX) 0.4 MG CAPS capsule Take 1 capsule (0.4 mg total) by mouth daily. 14  capsule Everlene Other G, DO     PDMP not reviewed this encounter.   Tommie Sams, [place] 04/17/19 1337

## 2019-04-17 NOTE — ED Triage Notes (Signed)
Patient in today c/o right flank pain and urinary frequency x 2 weeks. Patient seen for pain at [facility] on 03/26/19 and treated with antibiotics and given pain medication. Patient now having urinary frequency as well as pain. Patient states that her symptoms never resolved from 03/26/19 visit.

## 2019-04-27 ENCOUNTER — Encounter: Payer: Self-pay | Admitting: Urology

## 2019-06-22 ENCOUNTER — Other Ambulatory Visit: Payer: Self-pay | Admitting: Family Medicine

## 2019-06-22 DIAGNOSIS — M1732 Unilateral post-traumatic osteoarthritis, left knee: Secondary | ICD-10-CM

## 2019-10-01 ENCOUNTER — Other Ambulatory Visit: Payer: Self-pay

## 2019-10-01 ENCOUNTER — Ambulatory Visit
Admission: EM | Admit: 2019-10-01 | Discharge: 2019-10-01 | Disposition: A | Discharge status: Home / Self Care | Payer: BC Managed Care – PPO | Attending: Emergency Medicine | Admitting: Emergency Medicine

## 2019-10-01 DIAGNOSIS — M7631 Iliotibial band syndrome, right leg: Secondary | ICD-10-CM | POA: Diagnosis not present

## 2019-10-01 MED ORDER — PREDNISONE 10 MG PO TABS: 40.0000 mg | ORAL_TABLET | Freq: Every day | ORAL | 0 refills | Status: AC

## 2019-10-01 NOTE — ED Provider Notes (Addendum)
[facility]    CSN: [id number] Arrival date & time: 10/01/19  0940      History   Chief Complaint Chief Complaint  Patient presents with  . Hip Pain    HPI Laura Christian is a 49 y.o. female.   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 The pain started acutely when she got up off of her couch. The pain starts at the right hip and radiates to the right knee on the outside. Denies low back pain, pain in her buttock, numbness, or tingling. There was no trauma or injury associated with the onset of pain. She has used horse liniment, heat and stretching with mild relief. Ibuprofen and Tylenol have not had any effect. She has a history of sciatica but this pain is different. The pain is described as shooting with change of position or when using stairs, and aching when at rest. Patient exercises regularly, has been for 6-8 months, no changes to her routine. She lifts weights, and alternates between the elipt8ical and treadmill.   The history is provided by the patient.  Hip Pain This is a new problem. The current episode started more than 2 days ago. The problem occurs constantly. The problem has not changed since onset.Pertinent negatives include no chest pain, no headaches and no shortness of breath. The symptoms are aggravated by walking. The symptoms are relieved by heat.    Past Medical History:  Diagnosis Date  . Anxiety   . Depression   . Family history of ovarian cancer    10/18 cancer genetic testing letter sent  . GERD (gastroesophageal reflux disease)   . Hypertension   . Insomnia     Patient Active Problem List   Diagnosis Date Noted  . Uterine cramping 10/02/2016  . Chronic anxiety 10/02/2016  . Hypertension 10/02/2016    Past Surgical History:  Procedure Laterality Date  . ABLATION  05/2015  . MEDIAL PARTIAL KNEE REPLACEMENT Left     OB History    Gravida  4   Para  4   Term  4   Preterm      AB       Living  4     SAB      TAB      Ectopic      Multiple      Live Births  4            Home Medications    Prior to Admission medications   Medication Sig Start Date End Date Taking? Authorizing Provider  Calcium Carbonate (CALTRATE 600 PO) Take 1 tablet by mouth daily.    [provider]  conjugated estrogens (PREMARIN) vaginal cream Place 1 Applicatorful vaginally 2 (two) times a week. 1 gram vaginally at bedtime twice weekly 02/05/19   Conard Novak, MD  Cranberry 1000 MG CAPS Take 1 capsule by mouth daily.    [provider]  gabapentin (NEURONTIN) 100 MG capsule Take 1 capsule (100 mg total) by mouth 2 (two) times daily. 03/20/19   Duanne Limerick, MD  HYDROcodone-acetaminophen (NORCO/VICODIN) 5-325 MG tablet Take 1 tablet by mouth every 8 (eight) hours as needed for moderate pain or severe pain. 04/17/19   Tommie Sams, DO  LORazepam (ATIVAN) 1 MG tablet Take 1 tablet by mouth daily. 07/21/15   [provider]  metoprolol succinate (TOPROL-XL) 100 MG 24 hr tablet Take 1 tablet (100 mg total)  by mouth daily. Take with or immediately following a meal. 03/20/19   Duanne Limerick, MD  naproxen (NAPROSYN) 500 MG tablet Take 1 tablet by mouth twice daily 06/22/19   Duanne Limerick, MD  omeprazole (PRILOSEC) 40 MG capsule Take 40 mg by mouth 2 (two) times daily. otc    [provider]  predniSONE (DELTASONE) 10 MG tablet Take 4 tablets (40 mg total) by mouth daily with breakfast for 6 days. 10/01/19 10/07/19  Becky Augusta, NP  sertraline (ZOLOFT) 50 MG tablet Take 1 tablet by mouth daily. 11/24/15   [provider]  tamsulosin (FLOMAX) 0.4 MG CAPS capsule Take 1 capsule (0.4 mg total) by mouth daily. 04/17/19   Tommie Sams, DO  traZODone (DESYREL) 50 MG tablet Take 1 tablet by mouth 2 (two) times daily. 11/24/15   [provider]  triamcinolone cream (KENALOG) 0.1 % Apply 1 application topically 2 (two) times daily. 02/17/16   Duanne Limerick, MD    Family History Family History  Problem Relation Age of Onset  . Ovarian cancer Maternal Grandmother 52  . Lung cancer Paternal Grandmother 70  . Alzheimer's disease Mother   . Other Mother        complications of covid  . Healthy Father   . Diabetes Neg Hx   . Hypertension Neg Hx   . Stroke Neg Hx   . Thyroid disease Neg Hx     Social History Social History   Tobacco Use  . Smoking status: Never Smoker  . Smokeless tobacco: Never Used  Vaping Use  . Vaping Use: Never used  Substance Use Topics  . Alcohol use: Yes    Comment: occasionally  . Drug use: No     Allergies   Depakote [valproic acid]   Review of Systems Review of Systems  Constitutional: Negative for activity change, fatigue and fever.  HENT: Negative for congestion and sore throat.   Respiratory: Negative for cough and shortness of breath.   Cardiovascular: Negative for chest pain.  Genitourinary: Negative for difficulty urinating.  Musculoskeletal: Negative for back pain, gait problem and joint swelling.  Skin: Negative for color change and rash.  Neurological: Negative for numbness and headaches.  Hematological: Negative for adenopathy. Does not bruise/bleed easily.  Psychiatric/Behavioral: Negative.      Physical Exam Triage Vital Signs ED Triage Vitals  Enc Vitals Group     BP 10/01/19 0952 134/88     Pulse Rate 10/01/19 0952 69     Resp 10/01/19 0952 16     Temp 10/01/19 0952 97.6 F (36.4 C)     Temp Source 10/01/19 0952 Oral     SpO2 10/01/19 0952 100 %     Weight 10/01/19 0952 251 lb (113.9 kg)     Height 10/01/19 0952 5\' 8"  (1.727 m)     Head Circumference --      Peak Flow --      Pain Score 10/01/19 0951 9     Pain Loc --      Pain Edu? --      Excl. in GC? --    No data found.  Updated Vital Signs BP 134/88 (BP Location: Right Arm)   Pulse 69   Temp 97.6 F (36.4 C) (Oral)   Resp 16   Ht 5\' 8"  (1.727 m)   Wt 251 lb (113.9 kg)   SpO2 100%   BMI  38.16 kg/m   Visual Acuity Right Eye Distance:  Left Eye Distance:   Bilateral Distance:    Right Eye Near:   Left Eye Near:    Bilateral Near:     Physical Exam Constitutional:      General: She is not in acute distress.    Appearance: She is obese. She is not toxic-appearing or diaphoretic.  HENT:     Head: Normocephalic and atraumatic.     Nose: Nose normal.  Cardiovascular:     Rate and Rhythm: Regular rhythm.     Pulses: Normal pulses.     Heart sounds: Normal heart sounds.  Pulmonary:     Effort: Pulmonary effort is normal.     Breath sounds: Normal breath sounds.  Musculoskeletal:        General: No swelling or signs of injury.     Cervical back: Normal range of motion and neck supple.     Right hip: Tenderness present. No deformity, lacerations, bony tenderness or crepitus. Normal range of motion. Normal strength.     Left hip: Normal.     Right upper leg: Tenderness present. No swelling.     Left upper leg: Normal.     Right lower leg: Normal. No swelling. No edema.     Left lower leg: No edema.     Comments: Tenderness to soft tissue of right lateral hip and thigh along IT band. No bony tenderness.   Skin:    General: Skin is dry.     Capillary Refill: Capillary refill takes less than 2 seconds.  Neurological:     General: No focal deficit present.     Mental Status: She is alert. Mental status is at baseline.  Psychiatric:        Mood and Affect: Mood normal.        Behavior: Behavior normal.        Thought Content: Thought content normal.      UC Treatments / Results  Labs (all labs ordered are listed, but only abnormal results are displayed) Labs Reviewed - No data to display  EKG   Radiology No results found.  Procedures Procedures (including critical care time)  Medications Ordered in UC Medications - No data to display  Initial Impression / Assessment and Plan / UC Course  I have reviewed the triage vital signs and the nursing  notes.  Pertinent labs & imaging results that were available during my care of the patient were reviewed by me and considered in my medical decision making (see chart for details).   Patient presents with right hip and leg pain.   Will gather imaging as appropriate, perform evaluation, consider medication for inflammation, and physical therapy exercises.   Final Clinical Impressions(s) / UC Diagnoses   Final diagnoses:  It band syndrome, right     Discharge Instructions     Take Prednisone 40 mg daily with food for 6 days.  Avoid use of elliptical or treadmill at this time, or any exercise that causes pain.   Continue to use the liniment and heat.  Follow the discharge instructions and follow the stretches as shown.    ED Prescriptions    Medication Sig Dispense Auth. Provider   predniSONE (DELTASONE) 10 MG tablet Take 4 tablets (40 mg total) by mouth daily with breakfast for 6 days. 24 tablet Becky Augusta, NP     I have reviewed the PDMP during this encounter.   Becky Augusta, NP 10/01/19 1049    Becky Augusta, NP 10/01/19 1511

## 2019-10-01 NOTE — ED Triage Notes (Signed)
Pt states almost a week of right hip pain radiating into right knee. History of sciatica but this is worse. No known injury.

## 2019-10-01 NOTE — Discharge Instructions (Signed)
Take Prednisone 40 mg daily with food for 6 days.  Avoid use of elliptical or treadmill at this time, or any exercise that causes pain.   Continue to use the liniment and heat.  Follow the discharge instructions and follow the stretches as shown.

## 2020-01-05 ENCOUNTER — Encounter: Payer: Self-pay | Admitting: Family Medicine

## 2020-01-05 ENCOUNTER — Other Ambulatory Visit: Payer: Self-pay

## 2020-01-05 ENCOUNTER — Ambulatory Visit: Payer: BC Managed Care – PPO | Admitting: Family Medicine

## 2020-01-05 VITALS — BP 128/80 | HR 88 | Ht 68.0 in | Wt 259.0 lb

## 2020-01-05 DIAGNOSIS — I10 Essential (primary) hypertension: Secondary | ICD-10-CM

## 2020-01-05 DIAGNOSIS — M1732 Unilateral post-traumatic osteoarthritis, left knee: Secondary | ICD-10-CM

## 2020-01-05 DIAGNOSIS — Z23 Encounter for immunization: Secondary | ICD-10-CM

## 2020-01-05 DIAGNOSIS — E785 Hyperlipidemia, unspecified: Secondary | ICD-10-CM

## 2020-01-05 DIAGNOSIS — Z6839 Body mass index (BMI) 39.0-39.9, adult: Secondary | ICD-10-CM

## 2020-01-05 MED ORDER — METOPROLOL SUCCINATE ER 100 MG PO TB24: 100.0000 mg | ORAL_TABLET | Freq: Every day | ORAL | 1 refills | Status: DC

## 2020-01-05 MED ORDER — NAPROXEN 500 MG PO TABS: 500.0000 mg | ORAL_TABLET | Freq: Two times a day (BID) | ORAL | 1 refills | Status: DC

## 2020-01-05 NOTE — Progress Notes (Signed)
Date:  01/05/2020   Name:  Laura Christian   DOB:  1970-12-14   MRN:  [id number]   Chief Complaint: Hypertension and Arthritis  Hypertension This is a chronic problem. The current episode started more than 1 year ago. The problem has been gradually improving since onset. The problem is controlled. Pertinent negatives include no anxiety, blurred vision, chest pain, headaches, malaise/fatigue, neck pain, orthopnea, palpitations, peripheral edema, PND, shortness of breath or sweats. There are no associated agents to hypertension. Risk factors for coronary artery disease include dyslipidemia. Past treatments include beta blockers. The current treatment provides moderate improvement. There are no compliance problems.  There is no history of angina, kidney disease, CAD/MI, CVA, heart failure, left ventricular hypertrophy, PVD or retinopathy. There is no history of chronic renal disease, a hypertension causing med or renovascular disease.  Arthritis Presents for follow-up visit. She complains of pain and stiffness. She reports no joint swelling or joint warmth. The symptoms have been stable. Affected locations include the left knee (partial replacement). Her pain is at a severity of 7/10. Pertinent negatives include no diarrhea, dysuria, fatigue, fever or rash.    Lab Results  Component Value Date   CREATININE 0.61 10/31/2018   BUN 12 10/31/2018   NA 143 10/31/2018   K 5.2 10/31/2018   CL 104 10/31/2018   CO2 26 10/31/2018   Lab Results  Component Value Date   CHOL 173 05/13/2018   HDL 40 05/13/2018   LDLCALC 106 (H) 05/13/2018   TRIG 137 05/13/2018   CHOLHDL 4.3 05/13/2018   Lab Results  Component Value Date   TSH 2.430 10/31/2018   Lab Results  Component Value Date   HGBA1C 5.2 10/31/2018   Lab Results  Component Value Date   WBC 4.3 10/31/2018   HGB 12.1 10/31/2018   HCT 38.5 10/31/2018   MCV 87 10/31/2018   PLT 258 10/31/2018   Lab Results  Component Value Date    ALT 26 10/31/2018   AST 56 (H) 10/31/2018   ALKPHOS 85 10/31/2018   BILITOT 0.3 10/31/2018     Review of Systems  Constitutional: Negative.  Negative for chills, fatigue, fever, malaise/fatigue and unexpected weight change.  HENT: Negative for congestion, ear discharge, ear pain, rhinorrhea, sinus pressure, sneezing and sore throat.   Eyes: Negative for blurred vision, double vision, photophobia, pain, discharge, redness and itching.  Respiratory: Negative for cough, shortness of breath, wheezing and stridor.   Cardiovascular: Negative for chest pain, palpitations, orthopnea and PND.  Gastrointestinal: Negative for abdominal pain, blood in stool, constipation, diarrhea, nausea and vomiting.  Endocrine: Negative for cold intolerance, heat intolerance, polydipsia, polyphagia and polyuria.  Genitourinary: Negative for dysuria, flank pain, frequency, hematuria, menstrual problem, pelvic pain, urgency, vaginal bleeding and vaginal discharge.  Musculoskeletal: Positive for arthritis and stiffness. Negative for arthralgias, back pain, joint swelling, myalgias and neck pain.  Skin: Negative for rash.  Allergic/Immunologic: Negative for environmental allergies and food allergies.  Neurological: Negative for dizziness, weakness, light-headedness, numbness and headaches.  Hematological: Negative for adenopathy. Does not bruise/bleed easily.  Psychiatric/Behavioral: Negative for dysphoric mood. The patient is not nervous/anxious.     Patient Active Problem List   Diagnosis Date Noted   Uterine cramping 10/02/2016   Chronic anxiety 10/02/2016   Hypertension 10/02/2016    Allergies  Allergen Reactions   Depakote [Valproic Acid] Nausea And Vomiting    Past Surgical History:  Procedure Laterality Date   ABLATION  05/2015   MEDIAL PARTIAL KNEE  REPLACEMENT Left     Social History   Tobacco Use   Smoking status: Never Smoker   Smokeless tobacco: Never Used  Vaping Use   Vaping  Use: Never used  Substance Use Topics   Alcohol use: Yes    Comment: occasionally   Drug use: No     Medication list has been reviewed and updated.  Current Meds  Medication Sig   Calcium Carbonate (CALTRATE 600 PO) Take 1 tablet by mouth daily.   conjugated estrogens (PREMARIN) vaginal cream Place 1 Applicatorful vaginally 2 (two) times a week. 1 gram vaginally at bedtime twice weekly   Cranberry 1000 MG CAPS Take 1 capsule by mouth daily.   LORazepam (ATIVAN) 1 MG tablet Take 1 tablet by mouth daily. cbc   metoprolol succinate (TOPROL-XL) 100 MG 24 hr tablet Take 1 tablet (100 mg total) by mouth daily. Take with or immediately following a meal.   naproxen (NAPROSYN) 500 MG tablet Take 1 tablet by mouth twice daily   omeprazole (PRILOSEC) 40 MG capsule Take 40 mg by mouth 2 (two) times daily. otc   sertraline (ZOLOFT) 50 MG tablet Take 1 tablet by mouth daily. cbc   traZODone (DESYREL) 50 MG tablet Take 1 tablet by mouth 2 (two) times daily. cbc   [DISCONTINUED] gabapentin (NEURONTIN) 100 MG capsule Take 1 capsule (100 mg total) by mouth 2 (two) times daily.    PHQ 2/9 Scores 01/05/2020 03/20/2019 02/05/2019 10/31/2018  PHQ - 2 Score 0 0 0 0  PHQ- 9 Score 0 - 0 0    GAD 7 : Generalized Anxiety Score 01/05/2020  Nervous, Anxious, on Edge 0  Control/stop worrying 0  Worry too much - different things 0  Trouble relaxing 0  Restless 0  Easily annoyed or irritable 0  Afraid - awful might happen 0  Total GAD 7 Score 0    BP Readings from Last 3 Encounters:  01/05/20 128/80  10/01/19 134/88  04/17/19 126/83    Physical Exam Vitals and nursing note reviewed.  Constitutional:      Appearance: She is well-developed and well-nourished. She is obese.  HENT:     Head: Normocephalic.     Right Ear: Tympanic membrane, ear canal and external ear normal.     Left Ear: Tympanic membrane, ear canal and external ear normal.     Nose: Nose normal. No congestion or  rhinorrhea.     Mouth/Throat:     Mouth: Oropharynx is clear and moist.  Eyes:     General: Lids are everted, no foreign bodies appreciated. No scleral icterus.       Left eye: No foreign body or hordeolum.     Extraocular Movements: EOM normal.     Conjunctiva/sclera: Conjunctivae normal.     Right eye: Right conjunctiva is not injected.     Left eye: Left conjunctiva is not injected.     Pupils: Pupils are equal, round, and reactive to light.  Neck:     Thyroid: No thyromegaly.     Vascular: No JVD.     Trachea: No tracheal deviation.  Cardiovascular:     Rate and Rhythm: Normal rate and regular rhythm.     Pulses: Intact distal pulses.     Heart sounds: Normal heart sounds. No murmur heard. No friction rub. No gallop.   Pulmonary:     Effort: Pulmonary effort is normal. No respiratory distress.     Breath sounds: Normal breath sounds. No stridor. No wheezing, rhonchi  or rales.  Chest:     Chest wall: No tenderness.  Abdominal:     General: Bowel sounds are normal.     Palpations: Abdomen is soft. There is no hepatosplenomegaly or mass.     Tenderness: There is no abdominal tenderness. There is no guarding or rebound.  Musculoskeletal:        General: No edema. Normal range of motion.     Cervical back: Normal range of motion and neck supple.     Right knee: Normal.     Left knee: Tenderness present over the medial joint line and lateral joint line. No MCL, LCL or patellar tendon tenderness.  Lymphadenopathy:     Cervical: No cervical adenopathy.  Skin:    General: Skin is warm.     Coloration: Skin is not pale.     Findings: No rash.  Neurological:     Mental Status: She is alert and oriented to person, place, and time.     Cranial Nerves: No cranial nerve deficit.     Deep Tendon Reflexes: Strength normal. Reflexes normal.  Psychiatric:        Mood and Affect: Mood and affect normal. Mood is not anxious or depressed.     Wt Readings from Last 3 Encounters:   01/05/20 259 lb (117.5 kg)  10/01/19 251 lb (113.9 kg)  04/17/19 247 lb (112 kg)    BP 128/80    Pulse 88    Ht 5\' 8"  (1.727 m)    Wt 259 lb (117.5 kg)    BMI 39.38 kg/m   Assessment and Plan: 1. Essential hypertension Chronic.  Controlled.  Stable.  Blood pressure is 128/80.  We will continue metoprolol XL 100 mg once a day.  Will check renal function panel for electrolytes and GFR. - metoprolol succinate (TOPROL-XL) 100 MG 24 hr tablet; Take 1 tablet (100 mg total) by mouth daily. Take with or immediately following a meal.  Dispense: 90 tablet; Refill: 1 - Renal Function Panel  2. Post-traumatic osteoarthritis of left knee Chronic.  Controlled.  Stable.  Patient has continued pain in her left knee.  We will continue naproxen 500 mg 1 twice a day.  Patient does not want to return to orthopedics yet who is said that they probably need to convert a partial knee to total knee for complete relief. - naproxen (NAPROSYN) 500 MG tablet; Take 1 tablet (500 mg total) by mouth 2 (two) times daily.  Dispense: 180 tablet; Refill: 1  3. Hyperlipidemia, unspecified hyperlipidemia type Chronic.  Controlled.  Stable.  Patient is currently controlling with diet.  LDL has been mildly elevated in the past and has not been checked for 2 years.  We will recheck lipid panel to see current status of lipid concerns - Lipid Panel With LDL/HDL Ratio  4. Class 2 severe obesity due to excess calories with serious comorbidity and body mass index (BMI) of 39.0 to 39.9 in adult [facility]) Health risks of being over weight were discussed and patient was counseled on weight loss options and exercise.  Patient is contemplating lap band surgery for work and will be contacting [facility] if any further information is needed. - Lipid Panel With LDL/HDL Ratio  5. Need for immunization against influenza Discussion and administration. - Flu Vaccine QUAD 36+ mos IM

## 2020-01-05 NOTE — Patient Instructions (Signed)
Mediterranean Diet A Mediterranean diet refers to food and lifestyle choices that are based on the traditions of countries located on the [place]. This way of eating has been shown to help prevent certain conditions and improve outcomes for people who have chronic diseases, like kidney disease and heart disease. What are tips for following this plan? Lifestyle  Cook and eat meals together with your family, when possible.  Drink enough fluid to keep your urine clear or pale yellow.  Be physically active every day. This includes: ? Aerobic exercise like running or swimming. ? Leisure activities like gardening, walking, or housework.  Get 7-8 hours of sleep each night.  If recommended by your health care provider, drink red wine in moderation. This means 1 glass a day for nonpregnant women and 2 glasses a day for men. A glass of wine equals 5 oz (150 mL). Reading food labels  Check the serving size of packaged foods. For foods such as rice and pasta, the serving size refers to the amount of cooked product, not dry.  Check the total fat in packaged foods. Avoid foods that have saturated fat or trans fats.  Check the ingredients list for added sugars, such as corn syrup.   Shopping  At the grocery store, buy most of your food from the areas near the walls of the store. This includes: ? Fresh fruits and vegetables (produce). ? Grains, beans, nuts, and seeds. Some of these may be available in unpackaged forms or large amounts (in bulk). ? Fresh seafood. ? Poultry and eggs. ? Low-fat dairy products.  Buy whole ingredients instead of prepackaged foods.  Buy fresh fruits and vegetables in-season from local farmers markets.  Buy frozen fruits and vegetables in resealable bags.  If you do not have access to quality fresh seafood, buy precooked frozen shrimp or canned fish, such as tuna, salmon, or sardines.  Buy small amounts of raw or cooked vegetables, salads, or olives from  the deli or salad bar at your store.  Stock your pantry so you always have certain foods on hand, such as olive oil, canned tuna, canned tomatoes, rice, pasta, and beans. Cooking  Cook foods with extra-virgin olive oil instead of using butter or other vegetable oils.  Have meat as a side dish, and have vegetables or grains as your main dish. This means having meat in small portions or adding small amounts of meat to foods like pasta or stew.  Use beans or vegetables instead of meat in common dishes like chili or lasagna.  Experiment with different cooking methods. Try roasting or broiling vegetables instead of steaming or sauteing them.  Add frozen vegetables to soups, stews, pasta, or rice.  Add nuts or seeds for added healthy fat at each meal. You can add these to yogurt, salads, or vegetable dishes.  Marinate fish or vegetables using olive oil, lemon juice, garlic, and fresh herbs. Meal planning  Plan to eat 1 vegetarian meal one day each week. Try to work up to 2 vegetarian meals, if possible.  Eat seafood 2 or more times a week.  Have healthy snacks readily available, such as: ? Vegetable sticks with hummus. ? Greek yogurt. ? Fruit and nut trail mix.  Eat balanced meals throughout the week. This includes: ? Fruit: 2-3 servings a day ? Vegetables: 4-5 servings a day ? Low-fat dairy: 2 servings a day ? Fish, poultry, or lean meat: 1 serving a day ? Beans and legumes: 2 or more servings a week ?   Nuts and seeds: 1-2 servings a day ? Whole grains: 6-8 servings a day ? Extra-virgin olive oil: 3-4 servings a day  Limit red meat and sweets to only a few servings a month   What are my food choices?  Mediterranean diet ? Recommended  Grains: Whole-grain pasta. Brown rice. Bulgar wheat. Polenta. Couscous. Whole-wheat bread. Oatmeal. Quinoa.  Vegetables: Artichokes. Beets. Broccoli. Cabbage. Carrots. Eggplant. Green beans. Chard. Kale. Spinach. Onions. Leeks. Peas. Squash.  Tomatoes. Peppers. Radishes.  Fruits: Apples. Apricots. Avocado. Berries. Bananas. Cherries. Dates. Figs. Grapes. Lemons. Melon. Oranges. Peaches. Plums. Pomegranate.  Meats and other protein foods: Beans. Almonds. Sunflower seeds. Pine nuts. Peanuts. Cod. Salmon. Scallops. Shrimp. Tuna. Tilapia. Clams. Oysters. Eggs.  Dairy: Low-fat milk. Cheese. Greek yogurt.  Beverages: Water. Red wine. Herbal tea.  Fats and oils: Extra virgin olive oil. Avocado oil. Grape seed oil.  Sweets and desserts: Greek yogurt with honey. Baked apples. Poached pears. Trail mix.  Seasoning and other foods: Basil. Cilantro. Coriander. Cumin. Mint. Parsley. Sage. Rosemary. Tarragon. Garlic. Oregano. Thyme. Pepper. Balsalmic vinegar. Tahini. Hummus. Tomato sauce. Olives. Mushrooms. ? Limit these  Grains: Prepackaged pasta or rice dishes. Prepackaged cereal with added sugar.  Vegetables: Deep fried potatoes (french fries).  Fruits: Fruit canned in syrup.  Meats and other protein foods: Beef. Pork. Lamb. Poultry with skin. Hot dogs. Bacon.  Dairy: Ice cream. Sour cream. Whole milk.  Beverages: Juice. Sugar-sweetened soft drinks. Beer. Liquor and spirits.  Fats and oils: Butter. Canola oil. Vegetable oil. Beef fat (tallow). Lard.  Sweets and desserts: Cookies. Cakes. Pies. Candy.  Seasoning and other foods: Mayonnaise. Premade sauces and marinades. The items listed may not be a complete list. Talk with your dietitian about what dietary choices are right for you. Summary  The Mediterranean diet includes both food and lifestyle choices.  Eat a variety of fresh fruits and vegetables, beans, nuts, seeds, and whole grains.  Limit the amount of red meat and sweets that you eat.  Talk with your health care provider about whether it is safe for you to drink red wine in moderation. This means 1 glass a day for nonpregnant women and 2 glasses a day for men. A glass of wine equals 5 oz (150 mL). This information  is not intended to replace advice given to you by your health care provider. Make sure you discuss any questions you have with your health care provider. Document Revised: 09/01/2015 Document Reviewed: 08/25/2015 Elsevier Patient Education  2020 [employer]  

## 2020-01-06 LAB — RENAL FUNCTION PANEL
Albumin: 4.4 g/dL (ref 3.8–4.8)
BUN/Creatinine Ratio: 24 — ABNORMAL HIGH (ref 9–23)
BUN: 16 mg/dL (ref 6–24)
CO2: 24 mmol/L (ref 20–29)
Calcium: 9.6 mg/dL (ref 8.7–10.2)
Chloride: 102 mmol/L (ref 96–106)
Creatinine, Ser: 0.66 mg/dL (ref 0.57–1.00)
GFR calc Af Amer: 120 mL/min/{1.73_m2} (ref >59)
GFR calc non Af Amer: 104 mL/min/{1.73_m2} (ref >59)
Glucose: 88 mg/dL (ref 65–99)
Phosphorus: 4 mg/dL (ref 3.0–4.3)
Potassium: 4.2 mmol/L (ref 3.5–5.2)
Sodium: 141 mmol/L (ref 134–144)

## 2020-01-06 LAB — LIPID PANEL WITH LDL/HDL RATIO
Cholesterol, Total: 208 mg/dL — ABNORMAL HIGH (ref 100–199)
HDL: 45 mg/dL (ref >39)
LDL Chol Calc (NIH): 130 mg/dL — ABNORMAL HIGH (ref 0–99)
LDL/HDL Ratio: 2.9 ratio (ref 0.0–3.2)
Triglycerides: 186 mg/dL — ABNORMAL HIGH (ref 0–149)
VLDL Cholesterol Cal: 33 mg/dL (ref 5–40)

## 2020-06-12 ENCOUNTER — Other Ambulatory Visit: Payer: Self-pay

## 2020-06-12 ENCOUNTER — Ambulatory Visit
Admission: EM | Admit: 2020-06-12 | Discharge: 2020-06-12 | Disposition: A | Discharge status: Home / Self Care | Payer: BC Managed Care – PPO

## 2020-06-12 ENCOUNTER — Encounter: Payer: Self-pay | Admitting: Emergency Medicine

## 2020-06-12 DIAGNOSIS — W57XXXA Bitten or stung by nonvenomous insect and other nonvenomous arthropods, initial encounter: Secondary | ICD-10-CM | POA: Diagnosis not present

## 2020-06-12 DIAGNOSIS — S00262A Insect bite (nonvenomous) of left eyelid and periocular area, initial encounter: Secondary | ICD-10-CM | POA: Diagnosis not present

## 2020-06-12 DIAGNOSIS — R21 Rash and other nonspecific skin eruption: Secondary | ICD-10-CM | POA: Diagnosis not present

## 2020-06-12 MED ORDER — DOXYCYCLINE HYCLATE 100 MG PO CAPS: 100.0000 mg | ORAL_CAPSULE | Freq: Two times a day (BID) | ORAL | 0 refills | Status: AC

## 2020-06-12 NOTE — Discharge Instructions (Signed)
The tick has been removed.  Keep this area clean and dry.  You can put some Neosporin on there.  Make sure you are watching out for any infection and follow-up if there is any significant redness or swelling that is not relieved with over-the-counter hydrocortisone cream already have pain, fever, body aches or significant fatigue.  I have sent doxycycline for you to treat for tickborne illnesses since the tick may have been in place for 72 hours or greater.  This is not really a Lyme disease endemic area, but will cover you for that possibility anyway.  Follow-up with [place] as needed.

## 2020-06-12 NOTE — ED Provider Notes (Addendum)
[facility]    CSN: [id number] Arrival date & time: 06/12/20  0957      History   Chief Complaint Chief Complaint  Patient presents with  . Rash  . Insect Bite    tic    HPI Laura Christian is a 50 y.o. female presenting for suspected tick embedded near her left thigh.  Patient states that she noticed a small black spot that she thought was just a scratch a couple of days ago and then she scratched at it and it seemed to bleed.  She says that her son took a look at it and thought it looked a tick so he tried to remove it with tweezers today but was unsuccessful.  Patient presents today hoping to have it removed if it is a tick.  She denies any pain.  There is some slight redness around it.  She says she is felt a little bit fatigued and kind of nauseous today.  No fevers or body aches.  Patient says she does have a dog that runs around in the woods but has the dog on flea and tick meds.  Patient has no other concerns.  HPI  Past Medical History:  Diagnosis Date  . Anxiety   . Depression   . Family history of ovarian cancer    10/18 cancer genetic testing letter sent  . GERD (gastroesophageal reflux disease)   . Hypertension   . Insomnia     Patient Active Problem List   Diagnosis Date Noted  . Uterine cramping 10/02/2016  . Chronic anxiety 10/02/2016  . Hypertension 10/02/2016    Past Surgical History:  Procedure Laterality Date  . ABLATION  05/2015  . MEDIAL PARTIAL KNEE REPLACEMENT Left     OB History    Gravida  4   Para  4   Term  4   Preterm      AB      Living  4     SAB      IAB      Ectopic      Multiple      Live Births  4            Home Medications    Prior to Admission medications   Medication Sig Start Date End Date Taking? Authorizing Provider  Calcium Carbonate (CALTRATE 600 PO) Take 1 tablet by mouth daily.   Yes [provider]  doxycycline (VIBRAMYCIN) 100 MG capsule Take 1 capsule (100 mg total)  by mouth 2 (two) times daily for 14 days. 06/12/20 06/26/20 Yes Eusebio Friendly B, PA-C  LORazepam (ATIVAN) 1 MG tablet Take 1 tablet by mouth daily. cbc 07/21/15  Yes [provider]  metoprolol succinate (TOPROL-XL) 100 MG 24 hr tablet Take 1 tablet (100 mg total) by mouth daily. Take with or immediately following a meal. 01/05/20  Yes Duanne Limerick, MD  omeprazole (PRILOSEC) 40 MG capsule Take 40 mg by mouth 2 (two) times daily. otc   Yes [provider]  sertraline (ZOLOFT) 50 MG tablet Take 1 tablet by mouth daily. cbc 11/24/15  Yes [provider]  traZODone (DESYREL) 50 MG tablet Take 1 tablet by mouth 2 (two) times daily. cbc 11/24/15  Yes [provider]  zolpidem (AMBIEN CR) 12.5 MG CR tablet Take 12.5 mg by mouth at bedtime. 05/20/20  Yes [provider]  conjugated estrogens (PREMARIN) vaginal cream Place 1 Applicatorful vaginally 2 (two) times a week. 1 gram vaginally at  bedtime twice weekly 02/05/19   Conard NovakJackson, Stephen D, MD  Cranberry 1000 MG CAPS Take 1 capsule by mouth daily.    [provider]  naproxen (NAPROSYN) 500 MG tablet Take 1 tablet (500 mg total) by mouth 2 (two) times daily. 01/05/20   Duanne LimerickJones, Deanna C, MD  triamcinolone cream (KENALOG) 0.1 % Apply 1 application topically 2 (two) times daily. Patient not taking: Reported on 01/05/2020 02/17/16   Duanne LimerickJones, Deanna C, MD    Family History Family History  Problem Relation Age of Onset  . Ovarian cancer Maternal Grandmother 4965  . Lung cancer Paternal Grandmother 2465  . Alzheimer's disease Mother   . Other Mother        complications of covid  . Healthy Father   . Diabetes Neg Hx   . Hypertension Neg Hx   . Stroke Neg Hx   . Thyroid disease Neg Hx     Social History Social History   Tobacco Use  . Smoking status: Never Smoker  . Smokeless tobacco: Never Used  Vaping Use  . Vaping Use: Never used  Substance Use Topics  . Alcohol use: Yes    Comment: occasionally  .  Drug use: No     Allergies   Depakote [valproic acid]   Review of Systems Review of Systems  Constitutional: Positive for fatigue. Negative for fever.  Gastrointestinal: Positive for nausea. Negative for vomiting.  Musculoskeletal: Negative for arthralgias and myalgias.  Skin: Positive for color change. Negative for rash.  Neurological: Negative for weakness and headaches.     Physical Exam Triage Vital Signs ED Triage Vitals  Enc Vitals Group     BP 06/12/20 1013 138/78     Pulse Rate 06/12/20 1013 63     Resp 06/12/20 1013 14     Temp 06/12/20 1013 97.7 F (36.5 C)     Temp Source 06/12/20 1013 Oral     SpO2 06/12/20 1013 100 %     Weight 06/12/20 1010 272 lb (123.4 kg)     Height 06/12/20 1010 5\' 8"  (1.727 m)     Head Circumference --      Peak Flow --      Pain Score 06/12/20 1009 3     Pain Loc --      Pain Edu? --      Excl. in GC? --    No data found.  Updated Vital Signs BP 138/78 (BP Location: Left Arm)   Pulse 63   Temp 97.7 F (36.5 C) (Oral)   Resp 14   Ht 5\' 8"  (1.727 m)   Wt 272 lb (123.4 kg)   SpO2 100%   BMI 41.36 kg/m       Physical Exam Vitals and nursing note reviewed.  Constitutional:      General: She is not in acute distress.    Appearance: Normal appearance. She is not ill-appearing or toxic-appearing.  HENT:     Head: Normocephalic and atraumatic.  Eyes:     General: No scleral icterus.       Right eye: No discharge.        Left eye: No discharge.     Conjunctiva/sclera: Conjunctivae normal.  Cardiovascular:     Rate and Rhythm: Normal rate and regular rhythm.     Heart sounds: Normal heart sounds.  Pulmonary:     Effort: Pulmonary effort is normal. No respiratory distress.  Musculoskeletal:     Cervical back: Neck supple.  Skin:  General: Skin is dry.     Comments: There is a very small black tick adjacent to the left eye (periorbital region) with mild surrounding erythema  Neurological:     General: No focal  deficit present.     Mental Status: She is alert. Mental status is at baseline.     Motor: No weakness.     Gait: Gait normal.  Psychiatric:        Mood and Affect: Mood normal.        Behavior: Behavior normal.        Thought Content: Thought content normal.      UC Treatments / Results  Labs (all labs ordered are listed, but only abnormal results are displayed) Labs Reviewed - No data to display  EKG   Radiology No results found.  Procedures Foreign Body Removal  Date/Time: 06/12/2020 10:33 AM Performed by: Shirlee Latch, PA-C Authorized by: Shirlee Latch, PA-C   Consent:    Consent obtained:  Verbal   Consent given by:  Patient   Risks, benefits, and alternatives were discussed: yes     Risks discussed:  Bleeding, incomplete removal, infection and pain   Alternatives discussed:  No treatment and observation Universal protocol:    Patient identity confirmed:  Verbally with patient Location:    Location:  Face   Face location:  L cheek   Depth:  Intradermal Pre-procedure details:    Imaging:  None Anesthesia:    Anesthesia method:  None Procedure details:    Removal mechanism:  Forceps   Foreign bodies recovered:  1   Description:  Dead small black tick   Intact foreign body removal: yes   Post-procedure details:    Confirmation:  No additional foreign bodies on visualization   Skin closure:  None   Dressing:  Open (no dressing)   Procedure completion:  Tolerated well, no immediate complications Comments:     Cleaned area before and after removal with alcohol wipe   (including critical care time)  Medications Ordered in UC Medications - No data to display  Initial Impression / Assessment and Plan / UC Course  I have reviewed the triage vital signs and the nursing notes.  Pertinent labs & imaging results that were available during my care of the patient were reviewed by me and considered in my medical decision making (see chart for details).    50 year old female presenting with tick embedded near the left eye.  Foreign body successfully/fully removed today with use of forceps.  Since tick had been attached for about 72 hours or greater, will cover patient with doxycycline for tickborne illnesses.  Advised her to keep the area clean and dry.  Advised over-the-counter Neosporin to try to prevent infection and hydrocortisone cream if there is any swelling.  Reviewed ED precautions related to tick bites with patient.   Final Clinical Impressions(s) / UC Diagnoses   Final diagnoses:  Rash  Tick bite of left periocular area, initial encounter     Discharge Instructions     The tick has been removed.  Keep this area clean and dry.  You can put some Neosporin on there.  Make sure you are watching out for any infection and follow-up if there is any significant redness or swelling that is not relieved with over-the-counter hydrocortisone cream already have pain, fever, body aches or significant fatigue.  I have sent doxycycline for you to treat for tickborne illnesses since the tick may have been in place for  72 hours or greater.  This is not really a Lyme disease endemic area, but will cover you for that possibility anyway.  Follow-up with [place] as needed.    ED Prescriptions    Medication Sig Dispense Auth. Provider   doxycycline (VIBRAMYCIN) 100 MG capsule Take 1 capsule (100 mg total) by mouth 2 (two) times daily for 14 days. 28 capsule Shirlee Latch, PA-C     PDMP not reviewed this encounter.   Shirlee Latch, PA-C 06/12/20 1037    Eusebio Friendly B, PA-C 06/12/20 1037

## 2020-06-12 NOTE — ED Triage Notes (Signed)
Patient states that she noticed redness and possible insect bite on the left side of her left eye 3 days ago.  Patient states that her son tried to remove a tic earlier this morning.

## 2020-07-05 ENCOUNTER — Other Ambulatory Visit: Payer: Self-pay

## 2020-07-05 ENCOUNTER — Ambulatory Visit: Payer: BC Managed Care – PPO | Admitting: Family Medicine

## 2020-07-05 ENCOUNTER — Encounter: Payer: Self-pay | Admitting: Family Medicine

## 2020-07-05 VITALS — BP 124/80 | HR 76 | Ht 68.0 in | Wt 261.0 lb

## 2020-07-05 DIAGNOSIS — I10 Essential (primary) hypertension: Secondary | ICD-10-CM

## 2020-07-05 DIAGNOSIS — M1732 Unilateral post-traumatic osteoarthritis, left knee: Secondary | ICD-10-CM | POA: Diagnosis not present

## 2020-07-05 DIAGNOSIS — R601 Generalized edema: Secondary | ICD-10-CM | POA: Diagnosis not present

## 2020-07-05 DIAGNOSIS — E669 Obesity, unspecified: Secondary | ICD-10-CM | POA: Diagnosis not present

## 2020-07-05 MED ORDER — NAPROXEN 500 MG PO TABS: 500.0000 mg | ORAL_TABLET | Freq: Two times a day (BID) | ORAL | 1 refills | Status: DC

## 2020-07-05 MED ORDER — METOPROLOL SUCCINATE ER 100 MG PO TB24: 100.0000 mg | ORAL_TABLET | Freq: Every day | ORAL | 1 refills | Status: DC

## 2020-07-05 NOTE — Progress Notes (Signed)
Date:  07/05/2020   Name:  Laura Christian   DOB:  21-May-1970   MRN:  [id number]   Chief Complaint: Hypertension and Knee Pain (Uses naproxen)  Hypertension This is a chronic problem. The current episode started more than 1 year ago. The problem has been gradually improving since onset. The problem is controlled. Pertinent negatives include no anxiety, blurred vision, chest pain, headaches, malaise/fatigue, neck pain, orthopnea, palpitations, peripheral edema, PND, shortness of breath or sweats. There are no associated agents to hypertension. Past treatments include beta blockers. The current treatment provides moderate improvement. There are no compliance problems.  There is no history of angina, kidney disease, CAD/MI, CVA, heart failure, left ventricular hypertrophy, PVD or retinopathy. There is no history of chronic renal disease, a hypertension causing med or renovascular disease.  Knee Pain  Incident onset: chronic. The pain is present in the left knee. The quality of the pain is described as aching. The pain is at a severity of 6/10. The pain is moderate. The pain has been Worsening since onset. Pertinent negatives include no loss of motion, loss of sensation or numbness. She has tried NSAIDs for the symptoms. The treatment provided moderate relief.   Lab Results  Component Value Date   CREATININE 0.66 01/05/2020   BUN 16 01/05/2020   NA 141 01/05/2020   K 4.2 01/05/2020   CL 102 01/05/2020   CO2 24 01/05/2020   Lab Results  Component Value Date   CHOL 208 (H) 01/05/2020   HDL 45 01/05/2020   LDLCALC 130 (H) 01/05/2020   TRIG 186 (H) 01/05/2020   CHOLHDL 4.3 05/13/2018   Lab Results  Component Value Date   TSH 2.430 10/31/2018   Lab Results  Component Value Date   HGBA1C 5.2 10/31/2018   Lab Results  Component Value Date   WBC 4.3 10/31/2018   HGB 12.1 10/31/2018   HCT 38.5 10/31/2018   MCV 87 10/31/2018   PLT 258 10/31/2018   Lab Results  Component Value  Date   ALT 26 10/31/2018   AST 56 (H) 10/31/2018   ALKPHOS 85 10/31/2018   BILITOT 0.3 10/31/2018     Review of Systems  Constitutional:  Negative for chills, fever and malaise/fatigue.  HENT:  Negative for drooling, ear discharge, ear pain and sore throat.   Eyes:  Negative for blurred vision.  Respiratory:  Negative for cough, shortness of breath and wheezing.   Cardiovascular:  Negative for chest pain, palpitations, orthopnea, leg swelling and PND.  Gastrointestinal:  Negative for abdominal pain, blood in stool, constipation, diarrhea and nausea.  Endocrine: Negative for polydipsia.  Genitourinary:  Negative for dysuria, frequency, hematuria and urgency.  Musculoskeletal:  Negative for back pain, myalgias and neck pain.  Skin:  Negative for rash.  Allergic/Immunologic: Negative for environmental allergies.  Neurological:  Negative for dizziness, numbness and headaches.  Hematological:  Does not bruise/bleed easily.  Psychiatric/Behavioral:  Negative for suicidal ideas. The patient is not nervous/anxious.    Patient Active Problem List   Diagnosis Date Noted   Uterine cramping 10/02/2016   Chronic anxiety 10/02/2016   Hypertension 10/02/2016    Allergies  Allergen Reactions   Depakote [Valproic Acid] Nausea And Vomiting    Past Surgical History:  Procedure Laterality Date   ABLATION  05/2015   MEDIAL PARTIAL KNEE REPLACEMENT Left     Social History   Tobacco Use   Smoking status: Never   Smokeless tobacco: Never  Vaping Use   Vaping  Use: Never used  Substance Use Topics   Alcohol use: Yes    Comment: occasionally   Drug use: No     Medication list has been reviewed and updated.  Current Meds  Medication Sig   Calcium Carbonate (CALTRATE 600 PO) Take 1 tablet by mouth daily.   Cranberry 1000 MG CAPS Take 1 capsule by mouth daily.   LORazepam (ATIVAN) 1 MG tablet Take 1 tablet by mouth daily. cbc   metoprolol succinate (TOPROL-XL) 100 MG 24 hr tablet  Take 1 tablet (100 mg total) by mouth daily. Take with or immediately following a meal.   naproxen (NAPROSYN) 500 MG tablet Take 1 tablet (500 mg total) by mouth 2 (two) times daily.   omeprazole (PRILOSEC) 40 MG capsule Take 40 mg by mouth 2 (two) times daily. otc   sertraline (ZOLOFT) 50 MG tablet Take 1 tablet by mouth daily. cbc   traZODone (DESYREL) 50 MG tablet Take 1 tablet by mouth 2 (two) times daily. cbc   triamcinolone cream (KENALOG) 0.1 % Apply 1 application topically 2 (two) times daily.   zolpidem (AMBIEN CR) 12.5 MG CR tablet Take 12.5 mg by mouth at bedtime.    PHQ 2/9 Scores 07/05/2020 01/05/2020 03/20/2019 02/05/2019  PHQ - 2 Score 0 0 0 0  PHQ- 9 Score 0 0 - 0    GAD 7 : Generalized Anxiety Score 07/05/2020 01/05/2020  Nervous, Anxious, on Edge 0 0  Control/stop worrying 0 0  Worry too much - different things 0 0  Trouble relaxing 0 0  Restless 0 0  Easily annoyed or irritable 0 0  Afraid - awful might happen 0 0  Total GAD 7 Score 0 0    BP Readings from Last 3 Encounters:  07/05/20 124/80  06/12/20 138/78  01/05/20 128/80    Physical Exam Vitals and nursing note reviewed.  Constitutional:      General: She is not in acute distress.    Appearance: She is not diaphoretic.  HENT:     Head: Normocephalic and atraumatic.     Right Ear: Tympanic membrane, ear canal and external ear normal. There is no impacted cerumen.     Left Ear: Tympanic membrane, ear canal and external ear normal. There is no impacted cerumen.     Nose: Nose normal.  Eyes:     General:        Right eye: No discharge.        Left eye: No discharge.     Conjunctiva/sclera: Conjunctivae normal.     Pupils: Pupils are equal, round, and reactive to light.  Neck:     Thyroid: No thyromegaly.     Vascular: No JVD.  Cardiovascular:     Rate and Rhythm: Normal rate and regular rhythm.     Heart sounds: Normal heart sounds, S1 normal and S2 normal. No murmur heard. No systolic murmur is  present.  No diastolic murmur is present.    No friction rub. No gallop. No S3 or S4 sounds.  Pulmonary:     Effort: Pulmonary effort is normal.     Breath sounds: Normal breath sounds. No wheezing, rhonchi or rales.  Chest:     Chest wall: No tenderness.  Abdominal:     General: Bowel sounds are normal.     Palpations: Abdomen is soft. There is no mass.     Tenderness: There is no abdominal tenderness. There is no guarding.  Musculoskeletal:  General: Normal range of motion.     Cervical back: Normal range of motion and neck supple.     Left knee: Tenderness present over the medial joint line and lateral joint line.     Right lower leg: 1+ Pitting Edema present.     Left lower leg: 2+ Pitting Edema present.  Lymphadenopathy:     Cervical: No cervical adenopathy.  Skin:    General: Skin is warm and dry.  Neurological:     Mental Status: She is alert.     Deep Tendon Reflexes: Reflexes are normal and symmetric.    Wt Readings from Last 3 Encounters:  07/05/20 261 lb (118.4 kg)  06/12/20 272 lb (123.4 kg)  01/05/20 259 lb (117.5 kg)    BP 124/80   Pulse 76   Ht 5\' 8"  (1.727 m)   Wt 261 lb (118.4 kg)   BMI 39.68 kg/m   Assessment and Plan:  1. Essential hypertension Chronic.  Controlled.  Stable.  Blood pressure 124/80.  Continue metoprolol XL 100 mg once a day.  Patient is currently off Lasix but is doing well with current dosing.  Will check CMP for electrolytes and GFR. - metoprolol succinate (TOPROL-XL) 100 MG 24 hr tablet; Take 1 tablet (100 mg total) by mouth daily. Take with or immediately following a meal.  Dispense: 90 tablet; Refill: 1 - Comprehensive Metabolic Panel (CMET)  2. Post-traumatic osteoarthritis of left knee Chronic.  Persistent.  Pain as tolerated but needs further evaluation.  I will continue naproxen 500 mg twice a day and we will obtain a knee x-ray because of the previous chronic.  Persistent.  Stable.  Patient used to have compression  stockings but we will refer with appointment - naproxen (NAPROSYN) 500 MG tablet; Take 1 tablet (500 mg total) by mouth 2 (two) times daily.  Dispense: 180 tablet; Refill: 1 - DG Knee Complete 4 Views Left; Future  3. Generalized edema Chronic.  Controlled.  Stable.  Patient used to be have compression stockings but these need to be reordered.  We have sent prescription to [facility] and patient will be measured.  4. Obesity (BMI 30-39.9) Health risks of being over weight were discussed and patient was counseled on weight loss options and exercise.  - Lipid Panel With LDL/HDL Ratio

## 2020-07-06 LAB — COMPREHENSIVE METABOLIC PANEL
ALT: 16 IU/L (ref 0–32)
AST: 17 IU/L (ref 0–40)
Albumin/Globulin Ratio: 1.8 (ref 1.2–2.2)
Albumin: 4.5 g/dL (ref 3.8–4.8)
Alkaline Phosphatase: 91 IU/L (ref 44–121)
BUN/Creatinine Ratio: 17 (ref 9–23)
BUN: 12 mg/dL (ref 6–24)
Bilirubin Total: 0.5 mg/dL (ref 0.0–1.2)
CO2: 22 mmol/L (ref 20–29)
Calcium: 9.6 mg/dL (ref 8.7–10.2)
Chloride: 100 mmol/L (ref 96–106)
Creatinine, Ser: 0.72 mg/dL (ref 0.57–1.00)
Globulin, Total: 2.5 g/dL (ref 1.5–4.5)
Glucose: 88 mg/dL (ref 65–99)
Potassium: 4.9 mmol/L (ref 3.5–5.2)
Sodium: 138 mmol/L (ref 134–144)
Total Protein: 7 g/dL (ref 6.0–8.5)
eGFR: 102 mL/min/{1.73_m2} (ref >59)

## 2020-07-06 LAB — LIPID PANEL WITH LDL/HDL RATIO
Cholesterol, Total: 207 mg/dL — ABNORMAL HIGH (ref 100–199)
HDL: 51 mg/dL (ref >39)
LDL Chol Calc (NIH): 144 mg/dL — ABNORMAL HIGH (ref 0–99)
LDL/HDL Ratio: 2.8 ratio (ref 0.0–3.2)
Triglycerides: 66 mg/dL (ref 0–149)
VLDL Cholesterol Cal: 12 mg/dL (ref 5–40)

## 2020-07-08 ENCOUNTER — Ambulatory Visit
Admission: RE | Admit: 2020-07-08 | Discharge: 2020-07-08 | Disposition: A | Discharge status: Home / Self Care | Payer: BC Managed Care – PPO | Attending: Family Medicine | Admitting: Family Medicine

## 2020-07-08 ENCOUNTER — Ambulatory Visit
Admission: RE | Admit: 2020-07-08 | Discharge: 2020-07-08 | Disposition: A | Discharge status: Home / Self Care | Payer: BC Managed Care – PPO | Source: Ambulatory Visit | Attending: Family Medicine | Admitting: Family Medicine

## 2020-07-08 ENCOUNTER — Other Ambulatory Visit: Payer: Self-pay

## 2020-07-08 DIAGNOSIS — M1732 Unilateral post-traumatic osteoarthritis, left knee: Secondary | ICD-10-CM | POA: Insufficient documentation

## 2020-07-12 ENCOUNTER — Ambulatory Visit: Payer: BC Managed Care – PPO | Admitting: Family Medicine

## 2020-07-12 ENCOUNTER — Encounter: Payer: Self-pay | Admitting: Family Medicine

## 2020-07-12 ENCOUNTER — Other Ambulatory Visit: Payer: Self-pay

## 2020-07-12 VITALS — BP 108/76 | HR 78 | Temp 97.9°F | Ht 68.0 in | Wt 263.0 lb

## 2020-07-12 DIAGNOSIS — M1732 Unilateral post-traumatic osteoarthritis, left knee: Secondary | ICD-10-CM | POA: Insufficient documentation

## 2020-07-12 DIAGNOSIS — Z96652 Presence of left artificial knee joint: Secondary | ICD-10-CM

## 2020-07-12 DIAGNOSIS — M1712 Unilateral primary osteoarthritis, left knee: Secondary | ICD-10-CM

## 2020-07-12 NOTE — Assessment & Plan Note (Signed)
Patient with left knee severe medial tibiofemoral osteoarthritis status post partial knee replacement unspecified.  Radiographic features are consistent with the same revealing significant subchondral sclerosis and patellofemoral compartment degeneration.  Her physical exam shows tenderness along the medial greater than lateral joint lines, minimally to the Pez anserine bursa and with MCL stressing.  She is currently dosing of naproxen scheduled with benefit, reviewed additional treatment strategies given comorbid relative risk factors including presence of intra-articular surgical hardware.  She will reach out to [facility] in regards to possible duloxetine initiation that can be managed through their office or ours once approved by them.  Additionally, she is considering genicular nerve block as a precursor step to possible ablation if the test is beneficial.  She will return for further consideration and evaluation.  Ultimately, she will need a transition to total knee arthroplasty, our goal is to forego this as long as possible.

## 2020-07-12 NOTE — Progress Notes (Signed)
Primary Care / Sports Medicine Office Visit  Patient Information:  Patient ID: Laura Christian, female DOB: 15-May-1970 Age: 50 y.o. MRN: [id number]   Laura Christian is a pleasant 50 y.o. female presenting with the following:  Chief Complaint  Patient presents with   New Patient (Initial Visit)   Knee Pain    Left; X-rays 07/08/20; taking naproxen 500 mg twice a day with some relief; partial knee replacement 2011 in [place] after fracturing femur and tore meniscus, has had pain ever since; worse when walking or standing for long periods of time; constant aching; also has lymphedema; 6/10 pain    Review of Systems pertinent details above   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   Primary osteoarthritis of left knee 07/12/2020   DDD (degenerative disc disease), lumbar 04/22/2017   Uterine cramping 10/02/2016   Chronic anxiety 10/02/2016   Hypertension 10/02/2016   Past Medical History:  Diagnosis Date   Anxiety    Depression    Family history of ovarian cancer    10/18 cancer genetic testing letter sent   GERD (gastroesophageal reflux disease)    Hypertension    Insomnia    Outpatient Encounter Medications as of 07/12/2020  Medication Sig   Calcium Carbonate (CALTRATE 600 PO) Take 1 tablet by mouth daily.   Cranberry 1000 MG CAPS Take 1 capsule by mouth daily.   LORazepam (ATIVAN) 1 MG tablet Take 1 tablet by mouth daily. cbc   metoprolol succinate (TOPROL-XL) 100 MG 24 hr tablet Take 1 tablet (100 mg total) by mouth daily. Take with or immediately following a meal.   naproxen (NAPROSYN) 500 MG tablet Take 1 tablet (500 mg total) by mouth 2 (two) times daily.   omeprazole (PRILOSEC) 40 MG capsule Take 40 mg by mouth 2 (two) times daily. otc   sertraline (ZOLOFT) 50 MG tablet Take 1 tablet by mouth daily. cbc   traZODone (DESYREL) 50 MG tablet Take 1 tablet by mouth 2 (two) times daily. cbc   triamcinolone  cream (KENALOG) 0.1 % Apply 1 application topically 2 (two) times daily.   zolpidem (AMBIEN CR) 12.5 MG CR tablet Take 12.5 mg by mouth at bedtime.   conjugated estrogens (PREMARIN) vaginal cream Place 1 Applicatorful vaginally 2 (two) times a week. 1 gram vaginally at bedtime twice weekly (Patient not taking: Reported on 07/12/2020)   guanFACINE (INTUNIV) 1 MG TB24 ER tablet Take 1 mg by mouth daily.   No facility-administered encounter medications on file as of 07/12/2020.   Past Surgical History:  Procedure Laterality Date   ABLATION  05/2015   KNEE ARTHROSCOPY Left 05/2009   MEDIAL PARTIAL KNEE REPLACEMENT Left 11/2009    Vitals:   07/12/20 1458  BP: 108/76  Pulse: 78  Temp: 97.9 F (36.6 C)  SpO2: 100%   Vitals:   07/12/20 1458  Weight: 263 lb (119.3 kg)  Height: 5\' 8"  (1.727 m)   Body mass index is 39.99 kg/m.  DG Knee Complete 4 Views Left  Result Date: 07/10/2020 CLINICAL DATA:  50 year old female with trauma to the left knee. EXAM: LEFT KNEE - COMPLETE 4+ VIEW COMPARISON:  None. FINDINGS: There is no acute fracture or dislocation. There is osteopenia with severe arthritic changes and severe narrowing of the medial compartment. Chronic appearing sclerotic changes with subcortical cyst along the medial tibial plateau. Postsurgical changes and screws of the distal femur. The hardware is intact. No  joint effusion. The soft tissues are unremarkable. IMPRESSION: 1. No acute fracture or dislocation. 2. Osteopenia with severe osteoarthritic changes. Electronically Signed   By: Elgie Collard M.D.   On: 07/10/2020 02:49     Independent interpretation of notes and tests performed by another provider:   Independent interpretation of left knee x-ray dated 07/08/2020 reveals surgical hardware at the distal medial femoral condyle, joint space loss at the medial tibiofemoral articulation with significant subchondral sclerosis, there is surgical hardware at the trochlea, significant  patellofemoral osteophyte presence, and lateral facet narrowing, no acute osseous process identified  Procedures performed:   None  Pertinent History, Exam, Impression, and Recommendations:   Primary osteoarthritis of left knee Patient with left knee severe medial tibiofemoral osteoarthritis status post partial knee replacement unspecified.  Radiographic features are consistent with the same revealing significant subchondral sclerosis and patellofemoral compartment degeneration.  Her physical exam shows tenderness along the medial greater than lateral joint lines, minimally to the Pez anserine bursa and with MCL stressing.  She is currently dosing of naproxen scheduled with benefit, reviewed additional treatment strategies given comorbid relative risk factors including presence of intra-articular surgical hardware.  She will reach out to [facility] in regards to possible duloxetine initiation that can be managed through their office or ours once approved by them.  Additionally, she is considering genicular nerve block as a precursor step to possible ablation if the test is beneficial.  She will return for further consideration and evaluation.  Ultimately, she will need a transition to total knee arthroplasty, our goal is to forego this as long as possible.    Orders & Medications No orders of the defined types were placed in this encounter.  No orders of the defined types were placed in this encounter.    Return Patient preference, need 40 minutes, for Left knee nerve block.     Jerrol Banana, MD   Primary Care Sports Medicine [facility]

## 2020-07-12 NOTE — Patient Instructions (Signed)
-   Can continue naproxen - Contact [facility] in regards to Cymbalta (duloxetine) for musculoskeletal pain - Return once scheduled for left knee genicular nerve block - Contact us for any questions between now and then

## 2020-07-20 ENCOUNTER — Ambulatory Visit: Payer: BC Managed Care – PPO | Admitting: Family Medicine

## 2020-07-20 ENCOUNTER — Encounter: Payer: Self-pay | Admitting: Family Medicine

## 2020-07-20 ENCOUNTER — Other Ambulatory Visit: Payer: Self-pay

## 2020-07-20 ENCOUNTER — Inpatient Hospital Stay (INDEPENDENT_AMBULATORY_CARE_PROVIDER_SITE_OTHER): Payer: BC Managed Care – PPO | Admitting: Radiology

## 2020-07-20 ENCOUNTER — Inpatient Hospital Stay: Payer: Self-pay | Admitting: Radiology

## 2020-07-20 VITALS — BP 112/82 | HR 74 | Temp 97.9°F | Ht 68.0 in | Wt 258.6 lb

## 2020-07-20 DIAGNOSIS — M1732 Unilateral post-traumatic osteoarthritis, left knee: Secondary | ICD-10-CM

## 2020-07-20 DIAGNOSIS — Z96652 Presence of left artificial knee joint: Secondary | ICD-10-CM

## 2020-07-20 NOTE — Progress Notes (Signed)
Primary Care / Sports Medicine Office Visit  Patient Information:  Patient ID: Laura Christian, female DOB: 1970-07-10 Age: 50 y.o. MRN: [id number]   Laura Christian is a pleasant 50 y.o. female presenting with the following:  Chief Complaint  Patient presents with   Follow-up left knee pain    Patient presents for follow-up to left knee pain in the setting of partial knee replacement. She is considering left knee genicular nerve block. GNB pre-procedure pain score: 5/10; GNB post-procedure pain score: 0/10 pain    Review of Systems pertinent details above   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/12/2020   DDD (degenerative disc disease), lumbar 04/22/2017   Uterine cramping 10/02/2016   Chronic anxiety 10/02/2016   Hypertension 10/02/2016   Past Medical History:  Diagnosis Date   Anxiety    Depression    Family history of ovarian cancer    10/18 cancer genetic testing letter sent   GERD (gastroesophageal reflux disease)    Hypertension    Insomnia    Outpatient Encounter Medications as of 07/20/2020  Medication Sig   Calcium Carbonate (CALTRATE 600 PO) Take 1 tablet by mouth daily.   Cranberry 1000 MG CAPS Take 1 capsule by mouth daily.   guanFACINE (INTUNIV) 1 MG TB24 ER tablet Take 1 mg by mouth daily.   LORazepam (ATIVAN) 1 MG tablet Take 1 tablet by mouth daily. cbc   metoprolol succinate (TOPROL-XL) 100 MG 24 hr tablet Take 1 tablet (100 mg total) by mouth daily. Take with or immediately following a meal.   naproxen (NAPROSYN) 500 MG tablet Take 1 tablet (500 mg total) by mouth 2 (two) times daily.   omeprazole (PRILOSEC) 40 MG capsule Take 40 mg by mouth 2 (two) times daily. otc   sertraline (ZOLOFT) 100 MG tablet Take 100 mg by mouth daily.   traZODone (DESYREL) 50 MG tablet Take 1 tablet by mouth 2 (two) times daily. cbc   triamcinolone cream  (KENALOG) 0.1 % Apply 1 application topically 2 (two) times daily.   zolpidem (AMBIEN CR) 12.5 MG CR tablet Take 12.5 mg by mouth at bedtime.   conjugated estrogens (PREMARIN) vaginal cream Place 1 Applicatorful vaginally 2 (two) times a week. 1 gram vaginally at bedtime twice weekly (Patient not taking: No sig reported)   [DISCONTINUED] sertraline (ZOLOFT) 50 MG tablet Take 1 tablet by mouth daily. cbc   No facility-administered encounter medications on file as of 07/20/2020.   Past Surgical History:  Procedure Laterality Date   ABLATION  05/2015   KNEE ARTHROSCOPY Left 05/2009   MEDIAL PARTIAL KNEE REPLACEMENT Left 11/2009    Vitals:   07/20/20 0801  BP: 112/82  Pulse: 74  Temp: 97.9 F (36.6 C)  SpO2: 99%   Vitals:   07/20/20 0801  Weight: 258 lb 9.6 oz (117.3 kg)  Height: 5\' 8"  (1.727 m)   Body mass index is 39.32 kg/m.     Independent interpretation of notes and tests performed by another provider:   None  Procedures performed:   Procedure:  Injection of left knee superolateral, superomedial, and inferomedial genicular nerves under ultrasound guidance. Ultrasound guidance utilized for genicular artery positioning at the superolateral, superomedial, inferomedial genicular nerve locations, hypoechoic response from injectate visualized Samsung HS60 device utilized with permanent recording / reporting. Consent obtained and verified. Skin prepped in a sterile fashion. Ethyl chloride spray for  topical local analgesia.  Completed without difficulty and tolerated well. Medication:  9 mL lidocaine 1% without epinephrine utilized for anesthetic Advised to contact for fevers/chills, erythema, induration, drainage, or persistent bleeding.   Pertinent History, Exam, Impression, and Recommendations:   Status post left partial knee replacement Patient is status post partial left knee replacement with remainder joint showing radiographic features of osteoarthritis, relatively  asymptomatic.  Given her young age and associated risk factors inclusive of hypertension, BMI 39.32, additional surgical options to be deferred until later date.  As such, she did elect to proceed with ultrasound-guided genicular nerve block utilizing lidocaine, the superolateral, superomedial, inferomedial genicular nerves were injected.  Patient tolerated the procedure well and her post procedure pain score was 0/5.  I did discuss next possible steps inclusive of genicular nerve ablation through pain and spine group, associated identified risk factors inclusive of the above reviewed, and she is amenable to proceeding to pain and spine group for further evaluation and management.  She can return to our group for repeat blockade if indicated, otherwise we will follow-up on an as-needed basis for this issue.    Orders & Medications No orders of the defined types were placed in this encounter.  Orders Placed This Encounter  Procedures   [place] LIMITED JOINT SPACE STRUCTURES LOW LEFT   [place] LIMITED JOINT SPACE STRUCTURES LOW LEFT   Ambulatory referral to Pain Clinic      Return if symptoms worsen or fail to improve.     Jerrol Banana, MD   Primary Care Sports Medicine [facility]

## 2020-07-20 NOTE — Assessment & Plan Note (Signed)
Patient is status post partial left knee replacement with remainder joint showing radiographic features of osteoarthritis, relatively asymptomatic.  Given her young age and associated risk factors inclusive of hypertension, BMI 39.32, additional surgical options to be deferred until later date.  As such, she did elect to proceed with ultrasound-guided genicular nerve block utilizing lidocaine, the superolateral, superomedial, inferomedial genicular nerves were injected.  Patient tolerated the procedure well and her post procedure pain score was 0/5.  I did discuss next possible steps inclusive of genicular nerve ablation through pain and spine group, associated identified risk factors inclusive of the above reviewed, and she is amenable to proceeding to pain and spine group for further evaluation and management.  She can return to our group for repeat blockade if indicated, otherwise we will follow-up on an as-needed basis for this issue.

## 2020-07-20 NOTE — Patient Instructions (Addendum)
Genicular Nerve Blocks A Genicular Nerve Block is a diagnostic injection used to determine whether pain and discomfort stem from inflammation within the knee joint. Diagnostic injections help doctors know whether or not specific treatments can help the knee.  Following a Genicular Nerve Block with a positive response, consideration for radiofrequency ablation is made. A radiofrequency ablation allows for longer pain relief for patients with a successful nerve block test.   - Avoid excessively strenuous activity for 24 hours after your genicular nerve block. - You may feel dizzy, lightheaded and nauseous after the procedure. Drinking water and eating lightly help control these symptoms. - You may notice some swelling at the injection site which can be treated by application of ice and pain medication.  A referral to pain & spine group has been placed, a referral coordinator will contact you in regards to scheduling

## 2020-11-25 ENCOUNTER — Other Ambulatory Visit: Payer: Self-pay | Admitting: Family Medicine

## 2020-11-25 DIAGNOSIS — M1732 Unilateral post-traumatic osteoarthritis, left knee: Secondary | ICD-10-CM

## 2020-11-26 NOTE — Telephone Encounter (Signed)
   Requested medications are on the active medication list yes  Last refill 10/22/20  Last visit 07/20/20  Future visit scheduled 01/05/21  Notes to clinic failed protocol due to hgb from 10/31/2018, more than 360 days ago. Please assess. Requested Prescriptions  Pending Prescriptions Disp Refills   naproxen (NAPROSYN) 500 MG tablet [Pharmacy Med Name: Naproxen 500 MG Oral Tablet] 180 tablet 0    Sig: Take 1 tablet by mouth twice daily     Analgesics:  NSAIDS Failed - 11/25/2020 10:40 PM      Failed - HGB in normal range and within 360 days    Hemoglobin  Date Value Ref Range Status  10/31/2018 12.1 11.1 - 15.9 g/dL Final          Passed - Cr in normal range and within 360 days    Creatinine, Ser  Date Value Ref Range Status  07/05/2020 0.72 0.57 - 1.00 mg/dL Final          Passed - Patient is not pregnant      Passed - Valid encounter within last 12 months    Recent Outpatient Visits           4 months ago Post-traumatic osteoarthritis of left knee   [facility] Jerrol Banana, MD   4 months ago Status post left partial knee replacement   [facility] Jerrol Banana, MD   4 months ago Essential hypertension   [facility] Duanne Limerick, MD   10 months ago Essential hypertension   [facility] Duanne Limerick, MD   1 year ago Essential hypertension   [facility] Duanne Limerick, MD       Future Appointments             In 1 month Duanne Limerick, MD [facility], [facility]

## 2021-01-05 ENCOUNTER — Telehealth: Payer: Self-pay

## 2021-01-05 ENCOUNTER — Ambulatory Visit: Payer: Self-pay | Admitting: Family Medicine

## 2021-01-05 NOTE — Telephone Encounter (Signed)
Called and left message concerning no show for appt today

## 2021-04-21 ENCOUNTER — Ambulatory Visit: Payer: BC Managed Care – PPO | Admitting: Family Medicine

## 2021-04-21 ENCOUNTER — Encounter: Payer: Self-pay | Admitting: Family Medicine

## 2021-04-21 VITALS — BP 120/80 | HR 80 | Ht 68.0 in | Wt 270.0 lb

## 2021-04-21 DIAGNOSIS — M1732 Unilateral post-traumatic osteoarthritis, left knee: Secondary | ICD-10-CM

## 2021-04-21 DIAGNOSIS — E785 Hyperlipidemia, unspecified: Secondary | ICD-10-CM | POA: Diagnosis not present

## 2021-04-21 DIAGNOSIS — I10 Essential (primary) hypertension: Secondary | ICD-10-CM

## 2021-04-21 MED ORDER — METOPROLOL SUCCINATE ER 100 MG PO TB24: 100.0000 mg | ORAL_TABLET | Freq: Every day | ORAL | 1 refills | Status: DC

## 2021-04-21 MED ORDER — NAPROXEN 500 MG PO TABS: 500.0000 mg | ORAL_TABLET | Freq: Two times a day (BID) | ORAL | 1 refills | Status: DC

## 2021-04-21 NOTE — Progress Notes (Signed)
? ? ?Date:  04/21/2021  ? ?Name:  Laura Christian   DOB:  1970-04-13   MRN:  [id number] ? ? ?Chief Complaint: Hypertension and Knee Pain (Takes naproxen for knee pain) ? ?Hypertension ?This is a chronic problem. The current episode started more than 1 year ago. The problem has been gradually improving since onset. The problem is controlled. Pertinent negatives include no blurred vision, chest pain, headaches, neck pain, palpitations or shortness of breath. Risk factors for coronary artery disease include dyslipidemia. Past treatments include beta blockers. The current treatment provides moderate improvement. There are no compliance problems.  There is no history of angina, kidney disease, CAD/MI, CVA, heart failure, left ventricular hypertrophy, PVD or retinopathy. There is no history of chronic renal disease, a hypertension causing med or renovascular disease.  ?Knee Pain  ? ? ?Lab Results  ?Component Value Date  ? NA 138 07/05/2020  ? K 4.9 07/05/2020  ? CO2 22 07/05/2020  ? GLUCOSE 88 07/05/2020  ? BUN 12 07/05/2020  ? CREATININE 0.72 07/05/2020  ? CALCIUM 9.6 07/05/2020  ? EGFR 102 07/05/2020  ? GFRNONAA 104 01/05/2020  ? ?Lab Results  ?Component Value Date  ? CHOL 207 (H) 07/05/2020  ? HDL 51 07/05/2020  ? LDLCALC 144 (H) 07/05/2020  ? TRIG 66 07/05/2020  ? CHOLHDL 4.3 05/13/2018  ? ?Lab Results  ?Component Value Date  ? TSH 2.430 10/31/2018  ? ?Lab Results  ?Component Value Date  ? HGBA1C 5.2 10/31/2018  ? ?Lab Results  ?Component Value Date  ? WBC 4.3 10/31/2018  ? HGB 12.1 10/31/2018  ? HCT 38.5 10/31/2018  ? MCV 87 10/31/2018  ? PLT 258 10/31/2018  ? ?Lab Results  ?Component Value Date  ? ALT 16 07/05/2020  ? AST 17 07/05/2020  ? ALKPHOS 91 07/05/2020  ? BILITOT 0.5 07/05/2020  ? ?No results found for: 25OHVITD2, Bowman, VD25OH  ? ?Review of Systems  ?Constitutional:  Negative for chills and fever.  ?HENT:  Negative for drooling, ear discharge, ear pain and sore throat.   ?Eyes:  Negative for blurred  vision.  ?Respiratory:  Negative for cough, shortness of breath and wheezing.   ?Cardiovascular:  Negative for chest pain, palpitations and leg swelling.  ?Gastrointestinal:  Negative for abdominal pain, blood in stool, constipation, diarrhea and nausea.  ?Endocrine: Negative for polydipsia.  ?Genitourinary:  Negative for dysuria, frequency, hematuria and urgency.  ?Musculoskeletal:  Negative for back pain, myalgias and neck pain.  ?Skin:  Negative for rash.  ?Allergic/Immunologic: Negative for environmental allergies.  ?Neurological:  Negative for dizziness and headaches.  ?Hematological:  Does not bruise/bleed easily.  ?Psychiatric/Behavioral:  Negative for suicidal ideas. The patient is not nervous/anxious.   ? ?Patient Active Problem List  ? Diagnosis Date Noted  ? Status post left partial knee replacement 07/12/2020  ? Post-traumatic osteoarthritis of left knee 07/12/2020  ? DDD (degenerative disc disease), lumbar 04/22/2017  ? Uterine cramping 10/02/2016  ? Chronic anxiety 10/02/2016  ? Hypertension 10/02/2016  ? ? ?Allergies  ?Allergen Reactions  ? Valproic Acid Nausea And Vomiting  ? ? ?Past Surgical History:  ?Procedure Laterality Date  ? ABLATION  05/2015  ? KNEE ARTHROSCOPY Left 05/2009  ? MEDIAL PARTIAL KNEE REPLACEMENT Left 11/2009  ? ? ?Social History  ? ?Tobacco Use  ? Smoking status: Never  ? Smokeless tobacco: Never  ?Vaping Use  ? Vaping Use: Never used  ?Substance Use Topics  ? Alcohol use: Yes  ? Drug use: Never  ? ? ? ?  Medication list has been reviewed and updated. ? ?Current Meds  ?Medication Sig  ? Calcium Carbonate (CALTRATE 600 PO) Take 1 tablet by mouth daily.  ? conjugated estrogens (PREMARIN) vaginal cream Place 1 Applicatorful vaginally 2 (two) times a week. 1 gram vaginally at bedtime twice weekly  ? Cranberry 1000 MG CAPS Take 1 capsule by mouth daily.  ? metoprolol succinate (TOPROL-XL) 100 MG 24 hr tablet Take 1 tablet (100 mg total) by mouth daily. Take with or immediately following  a meal.  ? naproxen (NAPROSYN) 500 MG tablet Take 1 tablet by mouth twice daily  ? omeprazole (PRILOSEC) 40 MG capsule Take 40 mg by mouth 2 (two) times daily. otc  ? triamcinolone cream (KENALOG) 0.1 % Apply 1 application topically 2 (two) times daily.  ? ? ? ?  04/21/2021  ?  9:31 AM 07/20/2020  ?  8:42 AM 07/12/2020  ?  2:43 PM 07/05/2020  ? 10:01 AM  ?GAD 7 : Generalized Anxiety Score  ?Nervous, Anxious, on Edge 0 0 0 0  ?Control/stop worrying 0 0 0 0  ?Worry too much - different things 0 0 0 0  ?Trouble relaxing 0 0 0 0  ?Restless 0 0 0 0  ?Easily annoyed or irritable 0 0 0 0  ?Afraid - awful might happen 0 0 0 0  ?Total GAD 7 Score 0 0 0 0  ?Anxiety Difficulty Not difficult at all Not difficult at all Not difficult at all   ? ? ? ?  04/21/2021  ?  9:30 AM  ?Depression screen PHQ 2/9  ?Decreased Interest 0  ?Down, Depressed, Hopeless 0  ?PHQ - 2 Score 0  ?Altered sleeping 0  ?Tired, decreased energy 0  ?Change in appetite 0  ?Feeling bad or failure about yourself  0  ?Trouble concentrating 0  ?Moving slowly or fidgety/restless 0  ?Suicidal thoughts 0  ?PHQ-9 Score 0  ?Difficult doing work/chores Not difficult at all  ? ? ?BP Readings from Last 3 Encounters:  ?04/21/21 120/80  ?07/20/20 112/82  ?07/12/20 108/76  ? ? ?Physical Exam ?Vitals and nursing note reviewed. Exam conducted with a chaperone present.  ?Constitutional:   ?   General: She is not in acute distress. ?   Appearance: She is not diaphoretic.  ?HENT:  ?   Head: Normocephalic and atraumatic.  ?   Right Ear: Tympanic membrane and external ear normal.  ?   Left Ear: Tympanic membrane and external ear normal.  ?   Nose: Nose normal. No congestion or rhinorrhea.  ?Eyes:  ?   General:     ?   Right eye: No discharge.     ?   Left eye: No discharge.  ?   Conjunctiva/sclera: Conjunctivae normal.  ?   Pupils: Pupils are equal, round, and reactive to light.  ?Neck:  ?   Thyroid: No thyromegaly.  ?   Vascular: No JVD.  ?Cardiovascular:  ?   Rate and Rhythm: Normal  rate and regular rhythm.  ?   Heart sounds: Normal heart sounds. No murmur heard. ?  No friction rub. No gallop.  ?Pulmonary:  ?   Effort: Pulmonary effort is normal.  ?   Breath sounds: Normal breath sounds. No wheezing, rhonchi or rales.  ?Abdominal:  ?   General: Bowel sounds are normal.  ?   Palpations: Abdomen is soft. There is no mass.  ?   Tenderness: There is no abdominal tenderness. There is no guarding.  ?Musculoskeletal:     ?  General: Normal range of motion.  ?   Cervical back: Normal range of motion and neck supple.  ?Lymphadenopathy:  ?   Cervical: No cervical adenopathy.  ?Skin: ?   General: Skin is warm and dry.  ?Neurological:  ?   Mental Status: She is alert.  ?   Deep Tendon Reflexes: Reflexes are normal and symmetric.  ? ? ?Wt Readings from Last 3 Encounters:  ?04/21/21 270 lb (122.5 kg)  ?07/20/20 258 lb 9.6 oz (117.3 kg)  ?07/12/20 263 lb (119.3 kg)  ? ? ?BP 120/80   Pulse 80   Ht '5\' 8"'  (1.727 m)   Wt 270 lb (122.5 kg)   BMI 41.05 kg/m?  ? ?Assessment and Plan: ? ?1. Essential hypertension ?Chronic.  Controlled.  Stable.  Blood pressure is 120/80.  Continue metoprolol XL 100 mg once a day.  Will check renal function panel for electrolytes and GFR. ?- metoprolol succinate (TOPROL-XL) 100 MG 24 hr tablet; Take 1 tablet (100 mg total) by mouth daily. Take with or immediately following a meal.  Dispense: 90 tablet; Refill: 1 ?- Renal Function Panel ? ?2. Post-traumatic osteoarthritis of left knee ?Chronic.  Uncontrolled.  Stable.  Patient has had a partial knee replacement but is at the point that he most likely needs full replacement.  Patient is unable to afford gel injections and that is only partially covered by her insurance.  We will continue naproxen 500 mg twice a day. ?- naproxen (NAPROSYN) 500 MG tablet; Take 1 tablet (500 mg total) by mouth 2 (two) times daily.  Dispense: 180 tablet; Refill: 1 ? ?3. Hyperlipidemia, unspecified hyperlipidemia type ?Chronic.  Controlled.  Stable.   Patient is controlled with dietary approach.  Will check lipid panel for current status. ?- Lipid Panel With LDL/HDL Ratio  ? ? ?

## 2021-04-26 ENCOUNTER — Telehealth: Payer: Self-pay

## 2021-04-26 NOTE — Telephone Encounter (Signed)
Called pt- she had not had her labs drawn because "the line was horrible when I went." She stated she will go this week and get them drawn. ?

## 2021-10-23 ENCOUNTER — Encounter: Payer: Self-pay | Admitting: Family Medicine

## 2021-10-23 ENCOUNTER — Ambulatory Visit: Payer: BC Managed Care – PPO | Admitting: Family Medicine

## 2021-10-23 VITALS — BP 120/78 | HR 86 | Ht 68.0 in | Wt 268.0 lb

## 2021-10-23 DIAGNOSIS — I10 Essential (primary) hypertension: Secondary | ICD-10-CM

## 2021-10-23 DIAGNOSIS — Z1211 Encounter for screening for malignant neoplasm of colon: Secondary | ICD-10-CM | POA: Diagnosis not present

## 2021-10-23 DIAGNOSIS — E785 Hyperlipidemia, unspecified: Secondary | ICD-10-CM

## 2021-10-23 DIAGNOSIS — Z23 Encounter for immunization: Secondary | ICD-10-CM | POA: Diagnosis not present

## 2021-10-23 DIAGNOSIS — Z6839 Body mass index (BMI) 39.0-39.9, adult: Secondary | ICD-10-CM

## 2021-10-23 MED ORDER — METOPROLOL SUCCINATE ER 100 MG PO TB24: 100.0000 mg | ORAL_TABLET | Freq: Every day | ORAL | 1 refills | Status: DC

## 2021-10-23 NOTE — Patient Instructions (Signed)
Managing Your Hypertension Hypertension, also called high blood pressure, is when the force of the blood pressing against the walls of the arteries is too strong. Arteries are blood vessels that carry blood from your heart throughout your body. Hypertension forces the heart to work harder to pump blood and may cause the arteries to become narrow or stiff. Understanding blood pressure readings A blood pressure reading includes a higher number over a lower number: The first, or top, number is called the systolic pressure. It is a measure of the pressure in your arteries as your heart beats. The second, or bottom number, is called the diastolic pressure. It is a measure of the pressure in your arteries as the heart relaxes. For most people, a normal blood pressure is below 120/80. Your personal target blood pressure may vary depending on your medical conditions, your age, and other factors. Blood pressure is classified into four stages. Based on your blood pressure reading, your health care provider may use the following stages to determine what type of treatment you need, if any. Systolic pressure and diastolic pressure are measured in a unit called millimeters of mercury (mmHg). Normal Systolic pressure: below 120. Diastolic pressure: below 80. Elevated Systolic pressure: 120-129. Diastolic pressure: below 80. Hypertension stage 1 Systolic pressure: 130-139. Diastolic pressure: 80-89. Hypertension stage 2 Systolic pressure: 140 or above. Diastolic pressure: 90 or above. How can this condition affect me? Managing your hypertension is very important. Over time, hypertension can damage the arteries and decrease blood flow to parts of the body, including the brain, heart, and kidneys. Having untreated or uncontrolled hypertension can lead to: A heart attack. A stroke. A weakened blood vessel (aneurysm). Heart failure. Kidney damage. Eye damage. Memory and concentration problems. Vascular  dementia. What actions can I take to manage this condition? Hypertension can be managed by making lifestyle changes and possibly by taking medicines. Your health care provider will help you make a plan to bring your blood pressure within a normal range. You may be referred for counseling on a healthy diet and physical activity. Nutrition  Eat a diet that is high in fiber and potassium, and low in salt (sodium), added sugar, and fat. An example eating plan is called the DASH diet. DASH stands for Dietary Approaches to Stop Hypertension. To eat this way: Eat plenty of fresh fruits and vegetables. Try to fill one-half of your plate at each meal with fruits and vegetables. Eat whole grains, such as whole-wheat pasta, brown rice, or whole-grain bread. Fill about one-fourth of your plate with whole grains. Eat low-fat dairy products. Avoid fatty cuts of meat, processed or cured meats, and poultry with skin. Fill about one-fourth of your plate with lean proteins such as fish, chicken without skin, beans, eggs, and tofu. Avoid pre-made and processed foods. These tend to be higher in sodium, added sugar, and fat. Reduce your daily sodium intake. Many people with hypertension should eat less than 1,500 mg of sodium a day. Lifestyle  Work with your health care provider to maintain a healthy body weight or to lose weight. Ask what an ideal weight is for you. Get at least 30 minutes of exercise that causes your heart to beat faster (aerobic exercise) most days of the week. Activities may include walking, swimming, or biking. Include exercise to strengthen your muscles (resistance exercise), such as weight lifting, as part of your weekly exercise routine. Try to do these types of exercises for 30 minutes at least 3 days a week. Do   not use any products that contain nicotine or tobacco. These products include cigarettes, chewing tobacco, and vaping devices, such as e-cigarettes. If you need help quitting, ask your  health care provider. Control any long-term (chronic) conditions you have, such as high cholesterol or diabetes. Identify your sources of stress and find ways to manage stress. This may include meditation, deep breathing, or making time for fun activities. Alcohol use Do not drink alcohol if: Your health care provider tells you not to drink. You are pregnant, may be pregnant, or are planning to become pregnant. If you drink alcohol: Limit how much you have to: 0-1 drink a day for women. 0-2 drinks a day for men. Know how much alcohol is in your drink. In the [place] one drink equals one 12 oz bottle of beer (355 mL), one 5 oz glass of wine (148 mL), or one 1 oz glass of hard liquor (44 mL). Medicines Your health care provider may prescribe medicine if lifestyle changes are not enough to get your blood pressure under control and if: Your systolic blood pressure is 130 or higher. Your diastolic blood pressure is 80 or higher. Take medicines only as told by your health care provider. Follow the directions carefully. Blood pressure medicines must be taken as told by your health care provider. The medicine does not work as well when you skip doses. Skipping doses also puts you at risk for problems. Monitoring Before you monitor your blood pressure: Do not smoke, drink caffeinated beverages, or exercise within 30 minutes before taking a measurement. Use the bathroom and empty your bladder (urinate). Sit quietly for at least 5 minutes before taking measurements. Monitor your blood pressure at home as told by your health care provider. To do this: Sit with your back straight and supported. Place your feet flat on the floor. Do not cross your legs. Support your arm on a flat surface, such as a table. Make sure your upper arm is at heart level. Each time you measure, take two or three readings one minute apart and record the results. You may also need to have your blood pressure checked regularly by  your health care provider. General information Talk with your health care provider about your diet, exercise habits, and other lifestyle factors that may be contributing to hypertension. Review all the medicines you take with your health care provider because there may be side effects or interactions. Keep all follow-up visits. Your health care provider can help you create and adjust your plan for managing your high blood pressure. Where to find more information National Heart, Lung, and Blood Institute: www.nhlbi.nih.gov American Heart Association: www.heart.org Contact a health care provider if: You think you are having a reaction to medicines you have taken. You have repeated (recurrent) headaches. You feel dizzy. You have swelling in your ankles. You have trouble with your vision. Get help right away if: You develop a severe headache or confusion. You have unusual weakness or numbness, or you feel faint. You have severe pain in your chest or abdomen. You vomit repeatedly. You have trouble breathing. These symptoms may be an emergency. Get help right away. Call 911. Do not wait to see if the symptoms will go away. Do not drive yourself to the hospital. Summary Hypertension is when the force of blood pumping through your arteries is too strong. If this condition is not controlled, it may put you at risk for serious complications. Your personal target blood pressure may vary depending on your medical conditions,   your age, and other factors. For most people, a normal blood pressure is less than 120/80. Hypertension is managed by lifestyle changes, medicines, or both. Lifestyle changes to help manage hypertension include losing weight, eating a healthy, low-sodium diet, exercising more, stopping smoking, and limiting alcohol. This information is not intended to replace advice given to you by your health care provider. Make sure you discuss any questions you have with your health care  provider. Document Revised: 09/15/2020 Document Reviewed: 09/15/2020 Elsevier Patient Education  2023 [employer] GUIDELINES FOR  LOW-CHOLESTEROL, LOW-TRIGLYCERIDE DIETS    FOODS TO USE   MEATS, FISH Choose lean meats (chicken, turkey, veal, and non-fatty cuts of beef with excess fat trimmed; one serving = 3 oz of cooked meat). Also, fresh or frozen fish, canned fish packed in water, and shellfish (lobster, crabs, shrimp, and oysters). Limit use to no more than one serving of one of these per week. Shellfish are high in cholesterol but low in saturated fat and should be used sparingly. Meats and fish should be broiled (pan or oven) or baked on a rack.  EGGS Egg substitutes and egg whites (use freely). Egg yolks (limit two per week).  FRUITS Eat three servings of fresh fruit per day (1 serving =  cup). Be sure to have at least one citrus fruit daily. Frozen and canned fruit with no sugar or syrup added may be used.  VEGETABLES Most vegetables are not limited (see next page). One dark-green (string beans, escarole) or one deep yellow (squash) vegetable is recommended daily. Cauliflower, broccoli, and celery, as well as potato skins, are recommended for their fiber content. (Fiber is associated with cholesterol reduction) It is preferable to steam vegetables, but they may be boiled, strained, or braised with polyunsaturated vegetable oil (see below).  BEANS Dried peas or beans (1 serving =  cup) may be used as a bread substitute.  NUTS Almonds, walnuts, and peanuts may be used sparingly  (1 serving = 1 Tablespoonful). Use pumpkin, sesame, or sunflower seeds.  BREADS, GRAINS One roll or one slice of whole grain or enriched bread may be used, or three soda crackers or four pieces of melba toast as a substitute. Spaghetti, rice or noodles ( cup) or  large ear of corn may be used as a bread substitute. In preparing these foods do not use butter or shortening, use soft margarine. Also use egg and sugar  substitutes.  Choose high fiber grains, such as oats and whole wheat.  CEREALS Use  cup of hot cereal or  cup of cold cereal per day. Add a sugar substitute if desired, with 99% fat free or skim milk.  MILK PRODUCTS Always use 99% fat free or skim milk, dairy products such as low fat cheeses (farmer's uncreamed diet cottage), low-fat yogurt, and powdered skim milk.  FATS, OILS Use soft (not stick) margarine; vegetable oils that are high in polyunsaturated fats (such as safflower, sunflower, soybean, corn, and cottonseed). Always refrigerate meat drippings to harden the fat and remove it before preparing gravies  DESSERTS, SNACKS Limit to two servings per day; substitute each serving for a bread/cereal serving: ice milk, water sherbet (1/4 cup); unflavored gelatin or gelatin flavored with sugar substitute (1/3 cup); pudding prepared with skim milk (1/2 cup); egg white souffls; unbuttered popcorn (1  cups). Substitute carob for chocolate.  BEVERAGES Fresh fruit juices (limit 4 oz per day); black coffee, plain or herbal teas; soft drinks with sugar substitutes; club soda, preferably salt-free; cocoa made with   skim milk or nonfat dried milk and water (sugar substitute added if desired); clear broth. Alcohol: limit two servings per day (see second page).  MISCELLANEOUS  You may use the following freely: vinegar, spices, herbs, nonfat bouillon, mustard, Worcestershire sauce, soy sauce, flavoring essence.                  GUIDELINES FOR  LOW-CHOLESTEROL, LOW TRIGLYCERIDE DIETS    FOODS TO AVOID   MEATS, FISH Marbled beef, pork, bacon, sausage, and other pork products; fatty fowl (duck, goose); skin and fat of turkey and chicken; processed meats; luncheon meats (salami, bologna); frankfurters and fast-food hamburgers (theyre loaded with fat); organ meats (kidneys, liver); canned fish packed in oil.  EGGS Limit egg yolks to two per week.   FRUITS Coconuts (rich in saturated fats).   VEGETABLES Avoid avocados. Starchy vegetables (potatoes, corn, lima beans, dried peas, beans) may be used only if substitutes for a serving of bread or cereal. (Baked potato skin, however, is desirable for its fiber content.  BEANS Commercial baked beans with sugar and/or pork added.  NUTS Avoid nuts.  Limit peanuts and walnuts to one tablespoonful per day.  BREADS, GRAINS Any baked goods with shortening and/or sugar. Commercial mixes with dried eggs and whole milk. Avoid sweet rolls, doughnuts, breakfast pastries (Danish), and sweetened packaged cereals (the added sugar converts readily to triglycerides).  MILK PRODUCTS Whole milk and whole-milk packaged goods; cream; ice cream; whole-milk puddings, yogurt, or cheeses; nondairy cream substitutes.  FATS, OILS Butter, lard, animal fats, bacon drippings, gravies, cream sauces as well as palm and coconut oils. All these are high in saturated fats. Examine labels on cholesterol free products for hydrogenated fats. (These are oils that have been hardened into solids and in the process have become saturated.)  DESSERTS, SNACKS Fried snack foods like potato chips; chocolate; candies in general; jams, jellies, syrups; whole- milk puddings; ice cream and milk sherbets; hydrogenated peanut butter.  BEVERAGES Sugared fruit juices and soft drinks; cocoa made with whole milk and/or sugar. When using alcohol (1 oz liquor, 5 oz beer, or 2  oz dry table wine per serving), one serving must be substituted for one bread or cereal serving (limit, two servings of alcohol per day).   SPECIAL NOTES    Remember that even non-limited foods should be used in moderation. While on a cholesterol-lowering diet, be sure to avoid animal fats and marbled meats. 3. While on a triglyceride-lowering diet, be sure to avoid sweets and to control the amount of carbohydrates you eat (starchy foods such as flour, bread, potatoes).While on a tri-glyceride-lowering diet, be sure to avoid  sweets Buy a good low-fat cookbook, such as the one published by the American Heart Association. Consult your physician if you have any questions.               [facility] Low Glycemic Diet Plan   Low Glycemic Foods (20-49) Moderate Glycemic Foods (50-69) High Glycemic Foods (70-100)      Breakfast Creals Breakfast Cereals Breakfast Cereals  All Bran All-Bran Fruit'n Oats   Bran Buds Bran Chex   Cheerios Corn chex    Fiber One Oatmeal (not instant)   Just Right Mini-Wheats   Corn Flakes Cream of Wheat    Oat Bran Special K Swiss Muesli   Grape Nuts Grape Nut Flakes      Grits Nutri-Grain    Fruits and fruit juice: Fruits Puffed Rice Puffed Wheat    (Limit to 1-2   Servings per day) Banana (under-ride) Dates   Rice Chex Rice Krispies    Apples Apricots (fresh/dried)   Figs Grapes   Shredded Wheat Team    Blackberries Blueberries   Kiwi Mango   Total     Cherries Cranberries   Oranges Raisins     Peaches Pears    Fruits  Plums Prunes   Fruit Juices Pineapple Watermelon    Grapefruit Raspberries   Cranberry Juice Orange Juice   Banana (over-ripe)     Strawberries Tangerines      Apple Juice Grapefruit Juice   Beans and Legumes Beverages  Tomato Juice    Boston-type baked beans Sodas, sweet tea, pineapple juice   Canned pinto, kidney, or navy beans   Beans and Legumes (fresh-cooked) Green peas Vegetables  Black-eyed peas Butter Beans    Potato, baked, boiled, fried, mashed  Chick peas Lentils   Vegetables French fries  Green beans Lima beans   Beets Carrots   Canned or frozen corn  Kidney beans Navy beans   Sweet potato Yam   Parsnips  Pinto beans Snow peas   Corn on the cob Winter squash      Non-starchy vegetables Grains Breads  Asparagus, avocado, broccoli, cabbage Cornmeal Rice, brown   Most breads (white and whole grain)  cauliflower, celery, cucumber, greens Rice, white Couscous   Bagels Bread sticks    lettuce, mushrooms,  peppers, tomatoes  Bread stuffing Kaiser roll    okra, onions, spinach, summer squash Pasta Dinner rolls   Macaroni Pizza, cheese     Grains Ravioli, meat filled Spaghetti, white   Grains  Barley Bulgur    Rice, instant Tapioca, with milk    Rye Wild rice   Nuts    Cashews Macadamia   Candy and most cookies  Nuts and oils    Almonds, peanuts, sunflower seeds Snacks Snacks  hazelnuts, pecans, walnuts Chocolate Ice cream, lowfat   Donuts Corn chips    Oils that are liquid at room temperature Muffin Popcorn   Jelly beans Pretzels      Pastries  Dairy, fish, meat, soy, and eggs    Milk, skim Lowfat cheese    Restaurant and ethnic foods  Yogurt, lowfat, fruit sugar sweetened  Most Chinese food (sugar in stir fry    or wok sauce)  Lean red meat Fish    Teriyaki-style meats and vegetables  Skinless chicken and turkey, shellfish        Egg whites (up to 3 daily), Soy Products    Egg yolks (up to 7 or _____ per week)      

## 2021-10-23 NOTE — Progress Notes (Signed)
Date:  10/23/2021   Name:  Laura Christian   DOB:  1970/03/30   MRN:  [id number]   Chief Complaint: Hypertension, Flu Vaccine, and Colon Cancer Screening  Hypertension This is a chronic problem. The current episode started more than 1 year ago. The problem has been gradually improving since onset. The problem is controlled. Pertinent negatives include no blurred vision, chest pain, headaches, orthopnea, palpitations, PND or shortness of breath. There are no associated agents to hypertension. Past treatments include beta blockers. The current treatment provides moderate improvement. There are no compliance problems.  There is no history of angina, kidney disease, CAD/MI, CVA, heart failure, left ventricular hypertrophy, PVD or retinopathy. There is no history of chronic renal disease, a hypertension causing med or renovascular disease.    Lab Results  Component Value Date   NA 138 07/05/2020   K 4.9 07/05/2020   CO2 22 07/05/2020   GLUCOSE 88 07/05/2020   BUN 12 07/05/2020   CREATININE 0.72 07/05/2020   CALCIUM 9.6 07/05/2020   EGFR 102 07/05/2020   GFRNONAA 104 01/05/2020   Lab Results  Component Value Date   CHOL 207 (H) 07/05/2020   HDL 51 07/05/2020   LDLCALC 144 (H) 07/05/2020   TRIG 66 07/05/2020   CHOLHDL 4.3 05/13/2018   Lab Results  Component Value Date   TSH 2.430 10/31/2018   Lab Results  Component Value Date   HGBA1C 5.2 10/31/2018   Lab Results  Component Value Date   WBC 4.3 10/31/2018   HGB 12.1 10/31/2018   HCT 38.5 10/31/2018   MCV 87 10/31/2018   PLT 258 10/31/2018   Lab Results  Component Value Date   ALT 16 07/05/2020   AST 17 07/05/2020   ALKPHOS 91 07/05/2020   BILITOT 0.5 07/05/2020   No results found for: "25OHVITD2", "25OHVITD3", "VD25OH"   Review of Systems  Constitutional: Negative.  Negative for chills, fatigue, fever and unexpected weight change.  HENT:  Negative for congestion, ear discharge, ear pain, rhinorrhea, sinus  pressure, sneezing and sore throat.   Eyes:  Negative for blurred vision.  Respiratory:  Negative for cough, shortness of breath, wheezing and stridor.   Cardiovascular:  Negative for chest pain, palpitations, orthopnea and PND.  Gastrointestinal:  Negative for abdominal pain, blood in stool, constipation, diarrhea and nausea.  Genitourinary:  Negative for dysuria, flank pain, frequency, hematuria, urgency and vaginal discharge.  Musculoskeletal:  Negative for arthralgias, back pain and myalgias.  Skin:  Negative for rash.  Neurological:  Negative for dizziness, weakness and headaches.  Hematological:  Negative for adenopathy. Does not bruise/bleed easily.  Psychiatric/Behavioral:  Negative for dysphoric mood. The patient is not nervous/anxious.     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/12/2020   DDD (degenerative disc disease), lumbar 04/22/2017   Uterine cramping 10/02/2016   Chronic anxiety 10/02/2016   Hypertension 10/02/2016    Allergies  Allergen Reactions   Valproic Acid Nausea And Vomiting    Past Surgical History:  Procedure Laterality Date   ABLATION  05/2015   KNEE ARTHROSCOPY Left 05/2009   MEDIAL PARTIAL KNEE REPLACEMENT Left 11/2009    Social History   Tobacco Use   Smoking status: Never   Smokeless tobacco: Never  Vaping Use   Vaping Use: Never used  Substance Use Topics   Alcohol use: Yes   Drug use: Never  Medication list has been reviewed and updated.  Current Meds  Medication Sig   Calcium Carbonate (CALTRATE 600 PO) Take 1 tablet by mouth daily.   conjugated estrogens (PREMARIN) vaginal cream Place 1 Applicatorful vaginally 2 (two) times a week. 1 gram vaginally at bedtime twice weekly   Cranberry 1000 MG CAPS Take 1 capsule by mouth daily.   metoprolol succinate (TOPROL-XL) 100 MG 24 hr tablet Take 1 tablet (100 mg total) by  mouth daily. Take with or immediately following a meal.   naproxen (NAPROSYN) 500 MG tablet Take 1 tablet (500 mg total) by mouth 2 (two) times daily.   omeprazole (PRILOSEC) 40 MG capsule Take 40 mg by mouth 2 (two) times daily. otc   triamcinolone cream (KENALOG) 0.1 % Apply 1 application topically 2 (two) times daily.       10/23/2021    8:41 AM 04/21/2021    9:31 AM 07/20/2020    8:42 AM 07/12/2020    2:43 PM  GAD 7 : Generalized Anxiety Score  Nervous, Anxious, on Edge 0 0 0 0  Control/stop worrying 0 0 0 0  Worry too much - different things 0 0 0 0  Trouble relaxing 0 0 0 0  Restless 0 0 0 0  Easily annoyed or irritable 0 0 0 0  Afraid - awful might happen 0 0 0 0  Total GAD 7 Score 0 0 0 0  Anxiety Difficulty Not difficult at all Not difficult at all Not difficult at all Not difficult at all       10/23/2021    8:41 AM 04/21/2021    9:30 AM 07/20/2020    8:42 AM  Depression screen PHQ 2/9  Decreased Interest 0 0 0  Down, Depressed, Hopeless 0 0 0  PHQ - 2 Score 0 0 0  Altered sleeping 0 0 0  Tired, decreased energy 0 0 1  Change in appetite 0 0 0  Feeling bad or failure about yourself  0 0 0  Trouble concentrating 0 0 0  Moving slowly or fidgety/restless 0 0 0  Suicidal thoughts 0 0 0  PHQ-9 Score 0 0 1  Difficult doing work/chores Not difficult at all Not difficult at all Not difficult at all    BP Readings from Last 3 Encounters:  10/23/21 120/78  04/21/21 120/80  07/20/20 112/82    Physical Exam Vitals and nursing note reviewed. Exam conducted with a chaperone present.  Constitutional:      General: She is not in acute distress.    Appearance: She is not diaphoretic.  HENT:     Head: Normocephalic and atraumatic.     Right Ear: Tympanic membrane and external ear normal.     Left Ear: Tympanic membrane and external ear normal.     Nose: Nose normal. No congestion or rhinorrhea.     Mouth/Throat:     Mouth: Mucous membranes are moist.     Pharynx: Oropharynx  is clear. No oropharyngeal exudate or posterior oropharyngeal erythema.  Eyes:     General:        Right eye: No discharge.        Left eye: No discharge.     Conjunctiva/sclera: Conjunctivae normal.     Pupils: Pupils are equal, round, and reactive to light.  Neck:     Thyroid: No thyromegaly.     Vascular: No JVD.  Cardiovascular:     Rate and Rhythm: Normal rate and regular rhythm.  Heart sounds: Normal heart sounds. No murmur heard.    No friction rub. No gallop.  Pulmonary:     Effort: Pulmonary effort is normal.     Breath sounds: Normal breath sounds. No wheezing, rhonchi or rales.  Abdominal:     General: Bowel sounds are normal.     Palpations: Abdomen is soft. There is no mass.     Tenderness: There is no abdominal tenderness. There is no guarding or rebound.  Musculoskeletal:        General: Normal range of motion.     Cervical back: Normal range of motion and neck supple.  Lymphadenopathy:     Cervical: No cervical adenopathy.  Skin:    General: Skin is warm and dry.  Neurological:     Mental Status: She is alert.     Deep Tendon Reflexes: Reflexes are normal and symmetric.     Wt Readings from Last 3 Encounters:  10/23/21 268 lb (121.6 kg)  04/21/21 270 lb (122.5 kg)  07/20/20 258 lb 9.6 oz (117.3 kg)    BP 120/78 (Cuff Size: Large)   Pulse 86   Ht 5' 8" (1.727 m)   Wt 268 lb (121.6 kg)   BMI 40.75 kg/m   Assessment and Plan:  1. Essential hypertension Chronic.  Controlled.  Stable.  Blood pressure is 120/78.  Asymptomatic.  Stable.  Continue metoprolol XL 100 mg once a day.  Will check CMP for electrolytes and GFR. - metoprolol succinate (TOPROL-XL) 100 MG 24 hr tablet; Take 1 tablet (100 mg total) by mouth daily. Take with or immediately following a meal.  Dispense: 90 tablet; Refill: 1 - Comprehensive Metabolic Panel (CMET)  2. Hyperlipidemia, unspecified hyperlipidemia type Chronic.  Diet controlled.  Stable.  Reemphasized importance of  low-cholesterol low triglycerides. - Lipid Panel With LDL/HDL Ratio  3. Class 2 severe obesity due to excess calories with serious comorbidity and body mass index (BMI) of 39.0 to 39.9 in adult [facility]) Health risks of being over weight were discussed and patient was counseled on weight loss options and exercise.   4. Colon cancer screening Discussed with patient importance of surveillance and colonoscopy and appointment has been made for gastroenterology. - Ambulatory referral to Gastroenterology    Otilio Miu, MD

## 2021-10-24 LAB — COMPREHENSIVE METABOLIC PANEL
ALT: 18 IU/L (ref 0–32)
AST: 24 IU/L (ref 0–40)
Albumin/Globulin Ratio: 1.7 (ref 1.2–2.2)
Albumin: 4.5 g/dL (ref 3.9–4.9)
Alkaline Phosphatase: 113 IU/L (ref 44–121)
BUN/Creatinine Ratio: 16 (ref 9–23)
BUN: 14 mg/dL (ref 6–24)
Bilirubin Total: 0.3 mg/dL (ref 0.0–1.2)
CO2: 21 mmol/L (ref 20–29)
Calcium: 9.5 mg/dL (ref 8.7–10.2)
Chloride: 103 mmol/L (ref 96–106)
Creatinine, Ser: 0.85 mg/dL (ref 0.57–1.00)
Globulin, Total: 2.6 g/dL (ref 1.5–4.5)
Glucose: 91 mg/dL (ref 70–99)
Potassium: 4.6 mmol/L (ref 3.5–5.2)
Sodium: 143 mmol/L (ref 134–144)
Total Protein: 7.1 g/dL (ref 6.0–8.5)
eGFR: 83 mL/min/{1.73_m2} (ref >59)

## 2021-10-24 LAB — LIPID PANEL WITH LDL/HDL RATIO
Cholesterol, Total: 195 mg/dL (ref 100–199)
HDL: 44 mg/dL (ref >39)
LDL Chol Calc (NIH): 127 mg/dL — ABNORMAL HIGH (ref 0–99)
LDL/HDL Ratio: 2.9 ratio (ref 0.0–3.2)
Triglycerides: 131 mg/dL (ref 0–149)
VLDL Cholesterol Cal: 24 mg/dL (ref 5–40)

## 2021-10-29 ENCOUNTER — Other Ambulatory Visit: Payer: Self-pay | Admitting: Family Medicine

## 2021-10-29 DIAGNOSIS — M1732 Unilateral post-traumatic osteoarthritis, left knee: Secondary | ICD-10-CM

## 2021-12-21 ENCOUNTER — Ambulatory Visit: Payer: Self-pay

## 2021-12-21 ENCOUNTER — Encounter: Payer: Self-pay | Admitting: Family Medicine

## 2021-12-21 ENCOUNTER — Ambulatory Visit: Payer: BC Managed Care – PPO | Admitting: Family Medicine

## 2021-12-21 VITALS — BP 128/78 | HR 100 | Temp 97.8°F | Ht >= 80 in | Wt 291.0 lb

## 2021-12-21 DIAGNOSIS — R350 Frequency of micturition: Secondary | ICD-10-CM

## 2021-12-21 DIAGNOSIS — N12 Tubulo-interstitial nephritis, not specified as acute or chronic: Secondary | ICD-10-CM

## 2021-12-21 DIAGNOSIS — B379 Candidiasis, unspecified: Secondary | ICD-10-CM

## 2021-12-21 LAB — POCT URINALYSIS DIPSTICK
Bilirubin, UA: NEGATIVE
Glucose, UA: NEGATIVE
Ketones, UA: NEGATIVE
Nitrite, UA: NEGATIVE
Protein, UA: NEGATIVE
Spec Grav, UA: 1.01 (ref 1.010–1.025)
Urobilinogen, UA: 0.2 E.U./dL
pH, UA: 6 (ref 5.0–8.0)

## 2021-12-21 MED ORDER — SULFAMETHOXAZOLE-TRIMETHOPRIM 800-160 MG PO TABS: 1.0000 | ORAL_TABLET | Freq: Two times a day (BID) | ORAL | 0 refills | Status: AC

## 2021-12-21 MED ORDER — FLUCONAZOLE 150 MG PO TABS: 150.0000 mg | ORAL_TABLET | Freq: Once | ORAL | 0 refills | Status: AC

## 2021-12-21 NOTE — Telephone Encounter (Signed)
  Chief Complaint: right flank pain Symptoms: last night had rigors, sweating and vomited when pain woke her up Frequency: yesterday Pertinent Negatives: Patient denies urinary burning, urgency, frequency, blood in urine, abdominal pain Disposition: [] ED /[] Urgent Care (no appt availability in office) / [x] Appointment(In office/virtual)/ []  [place] [facility]/ [] Home Care/ [] Refused Recommended Disposition /[] [place] [facility]/ []  Follow-up with PCP Additional Notes:  Reason for Disposition  MODERATE pain (e.g., interferes with normal activities or awakens from sleep)  Answer Assessment - Initial Assessment Questions 1. LOCATION: "Where does it hurt?" (e.g., left, right)     right 2. ONSET: "When did the pain start?"     yest 3. SEVERITY: "How bad is the pain?" (e.g., Scale 1-10; mild, moderate, or severe)   - MILD (1-3): doesn't interfere with normal activities    - MODERATE (4-7): interferes with normal activities or awakens from sleep    - SEVERE (8-10): excruciating pain and patient unable to do normal activities (stays in bed)       moderate 4. PATTERN: "Does the pain come and go, or is it constant?"      Comes and goes 5. CAUSE: "What do you think is causing the pain?"     Kidney fevers 6. OTHER SYMPTOMS:  "Do you have any other symptoms?" (e.g., fever, abdomen pain, vomiting, leg weakness, burning with urination, blood in urine)     Rigors, sweating- vomiting last night 7. PREGNANCY:  "Is there any chance you are pregnant?" "When was your last menstrual period?"     N/a  Protocols used: Flank Pain-A-AH

## 2021-12-21 NOTE — Progress Notes (Signed)
Date:  12/21/2021   Name:  Laura Christian   DOB:  [birth year]   MRN:  [id number]   Chief Complaint: Back Pain (R) lower back pain- vomiting yesterday)  Urinary Frequency  This is a new problem. The current episode started in the past 7 days. The patient is experiencing no pain. The maximum temperature recorded prior to her arrival was 100 - 100.9 F. There is No history of pyelonephritis. Associated symptoms include chills, flank pain, frequency, sweats and urgency. Pertinent negatives include no hematuria. She has tried nothing for the symptoms.    Lab Results  Component Value Date   NA 143 10/23/2021   K 4.6 10/23/2021   CO2 21 10/23/2021   GLUCOSE 91 10/23/2021   BUN 14 10/23/2021   CREATININE 0.85 10/23/2021   CALCIUM 9.5 10/23/2021   EGFR 83 10/23/2021   GFRNONAA 104 01/05/2020   Lab Results  Component Value Date   CHOL 195 10/23/2021   HDL 44 10/23/2021   LDLCALC 127 (H) 10/23/2021   TRIG 131 10/23/2021   CHOLHDL 4.3 05/13/2018   Lab Results  Component Value Date   TSH 2.430 10/31/2018   Lab Results  Component Value Date   HGBA1C 5.2 10/31/2018   Lab Results  Component Value Date   WBC 4.3 10/31/2018   HGB 12.1 10/31/2018   HCT 38.5 10/31/2018   MCV 87 10/31/2018   PLT 258 10/31/2018   Lab Results  Component Value Date   ALT 18 10/23/2021   AST 24 10/23/2021   ALKPHOS 113 10/23/2021   BILITOT 0.3 10/23/2021   No results found for: "25OHVITD2", "25OHVITD3", "VD25OH"   Review of Systems  Constitutional:  Positive for chills, diaphoresis and fever.  Respiratory:  Negative for shortness of breath.   Cardiovascular:  Negative for chest pain and palpitations.  Genitourinary:  Positive for flank pain, frequency and urgency. Negative for hematuria.    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/12/2020   DDD (degenerative disc disease),  lumbar 04/22/2017   Uterine cramping 10/02/2016   Chronic anxiety 10/02/2016   Hypertension 10/02/2016    Allergies  Allergen Reactions   Valproic Acid Nausea And Vomiting    Past Surgical History:  Procedure Laterality Date   ABLATION  05/2015   KNEE ARTHROSCOPY Left 05/2009   MEDIAL PARTIAL KNEE REPLACEMENT Left 11/2009    Social History   Tobacco Use   Smoking status: Never   Smokeless tobacco: Never  Vaping Use   Vaping Use: Never used  Substance Use Topics   Alcohol use: Yes   Drug use: Never     Medication list has been reviewed and updated.  Current Meds  Medication Sig   Calcium Carbonate (CALTRATE 600 PO) Take 1 tablet by mouth daily.   conjugated estrogens (PREMARIN) vaginal cream Place 1 Applicatorful vaginally 2 (two) times a week. 1 gram vaginally at bedtime twice weekly   Cranberry 1000 MG CAPS Take 1 capsule by mouth daily.   metoprolol succinate (TOPROL-XL) 100 MG 24 hr tablet Take 1 tablet (100 mg total) by mouth daily. Take with or immediately following a meal.   naproxen (NAPROSYN) 500 MG tablet Take 1 tablet by mouth twice daily   omeprazole (PRILOSEC) 40 MG capsule Take 40 mg by mouth 2 (two) times daily. otc   triamcinolone cream (KENALOG) 0.1 % Apply 1 application topically 2 (two) times  daily.       12/21/2021    2:41 PM 10/23/2021    8:41 AM 04/21/2021    9:31 AM 07/20/2020    8:42 AM  GAD 7 : Generalized Anxiety Score  Nervous, Anxious, on Edge 0 0 0 0  Control/stop worrying 0 0 0 0  Worry too much - different things 0 0 0 0  Trouble relaxing 0 0 0 0  Restless 0 0 0 0  Easily annoyed or irritable 0 0 0 0  Afraid - awful might happen 0 0 0 0  Total GAD 7 Score 0 0 0 0  Anxiety Difficulty Not difficult at all Not difficult at all Not difficult at all Not difficult at all       12/21/2021    2:41 PM 10/23/2021    8:41 AM 04/21/2021    9:30 AM  Depression screen PHQ 2/9  Decreased Interest 0 0 0  Down, Depressed, Hopeless 0 0 0  PHQ -  2 Score 0 0 0  Altered sleeping 0 0 0  Tired, decreased energy 0 0 0  Change in appetite 0 0 0  Feeling bad or failure about yourself  0 0 0  Trouble concentrating 0 0 0  Moving slowly or fidgety/restless 0 0 0  Suicidal thoughts 0 0 0  PHQ-9 Score 0 0 0  Difficult doing work/chores Not difficult at all Not difficult at all Not difficult at all    BP Readings from Last 3 Encounters:  12/21/21 128/78  10/23/21 120/78  04/21/21 120/80    Physical Exam Vitals and nursing note reviewed. Exam conducted with a chaperone present.  Constitutional:      General: She is not in acute distress.    Appearance: She is not diaphoretic.  HENT:     Head: Normocephalic and atraumatic.     Right Ear: Tympanic membrane and external ear normal.     Left Ear: Tympanic membrane and external ear normal.     Nose: Nose normal.     Mouth/Throat:     Mouth: Mucous membranes are moist.  Eyes:     General:        Right eye: No discharge.        Left eye: No discharge.     Conjunctiva/sclera: Conjunctivae normal.     Pupils: Pupils are equal, round, and reactive to light.  Neck:     Thyroid: No thyromegaly.     Vascular: No JVD.  Cardiovascular:     Rate and Rhythm: Normal rate and regular rhythm.     Heart sounds: Normal heart sounds. No murmur heard.    No friction rub. No gallop.  Pulmonary:     Effort: Pulmonary effort is normal.     Breath sounds: Normal breath sounds. No wheezing, rhonchi or rales.  Abdominal:     General: Bowel sounds are normal.     Palpations: Abdomen is soft. There is no mass.     Tenderness: There is abdominal tenderness in the suprapubic area. There is right CVA tenderness. There is no left CVA tenderness or guarding.  Musculoskeletal:        General: Normal range of motion.     Cervical back: Normal range of motion and neck supple.  Lymphadenopathy:     Cervical: No cervical adenopathy.  Skin:    General: Skin is warm and dry.  Neurological:     Mental  Status: She is alert.     Deep Tendon Reflexes:  Reflexes are normal and symmetric.     Wt Readings from Last 3 Encounters:  12/21/21 291 lb (132 kg)  10/23/21 268 lb (121.6 kg)  04/21/21 270 lb (122.5 kg)    BP 128/78 (BP Location: Right Arm, Cuff Size: Large)   Pulse 100   Temp 97.8 F (36.6 C) (Oral)   Ht _0  (2.032 m)   Wt 291 lb (132 kg)   SpO2 98%   BMI 31.97 kg/m   Assessment and Plan: 1. Urine frequency New onset.  Persistent.  Relatively stable except for the onset of fever chills and diaphoresis last night.  There is CVA tenderness that is mild over the right costovertebral angle.  This is consistent with an early pyelonephritis.   - POCT urinalysis dipstick - Urine Culture  2. Pyelonephritis New onset.  Persistent.  Probably only in the last 24 hours.  Will treat with Septra DS 1 twice a day for 2 weeks.  Culture has been submitted and we will check in 48 hours to 72 hours for culture and sensitivities and adjust accordingly.  Patient has been instructed if symptoms should worsen to go to an urgent care or emergency room. - Urine Culture - sulfamethoxazole-trimethoprim (BACTRIM DS) 800-160 MG tablet; Take 1 tablet by mouth 2 (two) times daily for 14 days.  Dispense: 28 tablet; Refill: 0  3. Candidiasis Patient is subject to candidiasis and we have given her Diflucan to take on the onset of yeast infection. - fluconazole (DIFLUCAN) 150 MG tablet; Take 1 tablet (150 mg total) by mouth once for 1 dose.  Dispense: 1 tablet; Refill: 0     Otilio Miu, MD

## 2021-12-25 LAB — URINE CULTURE

## 2022-01-22 ENCOUNTER — Other Ambulatory Visit: Payer: Self-pay | Admitting: Family Medicine

## 2022-01-22 DIAGNOSIS — M1732 Unilateral post-traumatic osteoarthritis, left knee: Secondary | ICD-10-CM

## 2022-02-08 ENCOUNTER — Encounter: Payer: Self-pay | Admitting: Family Medicine

## 2022-02-08 ENCOUNTER — Ambulatory Visit: Payer: BC Managed Care – PPO | Admitting: Family Medicine

## 2022-02-08 VITALS — BP 122/64 | HR 80 | Temp 98.1°F | Ht 68.0 in | Wt 281.0 lb

## 2022-02-08 DIAGNOSIS — J019 Acute sinusitis, unspecified: Secondary | ICD-10-CM | POA: Diagnosis not present

## 2022-02-08 DIAGNOSIS — R062 Wheezing: Secondary | ICD-10-CM | POA: Diagnosis not present

## 2022-02-08 DIAGNOSIS — R6889 Other general symptoms and signs: Secondary | ICD-10-CM

## 2022-02-08 DIAGNOSIS — R051 Acute cough: Secondary | ICD-10-CM | POA: Diagnosis not present

## 2022-02-08 LAB — POCT INFLUENZA A/B
Influenza A, POC: NEGATIVE
Influenza B, POC: NEGATIVE

## 2022-02-08 LAB — POC COVID19 BINAXNOW: SARS Coronavirus 2 Ag: NEGATIVE

## 2022-02-08 MED ORDER — AZITHROMYCIN 250 MG PO TABS: ORAL_TABLET | ORAL | 0 refills | Status: AC

## 2022-02-08 MED ORDER — GUAIFENESIN-CODEINE 100-10 MG/5ML PO SYRP: 5.0000 mL | ORAL_SOLUTION | Freq: Three times a day (TID) | ORAL | 0 refills | Status: DC | PRN

## 2022-02-08 NOTE — Progress Notes (Signed)
Date:  02/08/2022   Name:  Laura Christian   DOB:  1971-01-03   MRN:  [id number]   Chief Complaint: Cough (Cough- started yesterday with green production, fatigue- been exposed to COVID and flu)  Cough This is a new problem. The current episode started in the past 7 days. The problem has been waxing and waning. The cough is Productive of purulent sputum (green). Associated symptoms include nasal congestion, postnasal drip, shortness of breath and wheezing. Pertinent negatives include no chest pain, chills, ear pain, fever, headaches, myalgias, rhinorrhea or sore throat. She has tried OTC cough suppressant for the symptoms. The treatment provided moderate relief. There is no history of asthma, bronchitis or pneumonia.    Lab Results  Component Value Date   NA 143 10/23/2021   K 4.6 10/23/2021   CO2 21 10/23/2021   GLUCOSE 91 10/23/2021   BUN 14 10/23/2021   CREATININE 0.85 10/23/2021   CALCIUM 9.5 10/23/2021   EGFR 83 10/23/2021   GFRNONAA 104 01/05/2020   Lab Results  Component Value Date   CHOL 195 10/23/2021   HDL 44 10/23/2021   LDLCALC 127 (H) 10/23/2021   TRIG 131 10/23/2021   CHOLHDL 4.3 05/13/2018   Lab Results  Component Value Date   TSH 2.430 10/31/2018   Lab Results  Component Value Date   HGBA1C 5.2 10/31/2018   Lab Results  Component Value Date   WBC 4.3 10/31/2018   HGB 12.1 10/31/2018   HCT 38.5 10/31/2018   MCV 87 10/31/2018   PLT 258 10/31/2018   Lab Results  Component Value Date   ALT 18 10/23/2021   AST 24 10/23/2021   ALKPHOS 113 10/23/2021   BILITOT 0.3 10/23/2021   No results found for: "25OHVITD2", "25OHVITD3", "VD25OH"   Review of Systems  Constitutional:  Negative for chills and fever.  HENT:  Positive for postnasal drip and sinus pressure. Negative for ear pain, rhinorrhea and sore throat.   Respiratory:  Positive for cough, shortness of breath and wheezing.   Cardiovascular:  Negative for chest pain, palpitations and leg  swelling.  Musculoskeletal:  Negative for myalgias.  Neurological:  Negative for headaches.    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/12/2020   DDD (degenerative disc disease), lumbar 04/22/2017   Uterine cramping 10/02/2016   Chronic anxiety 10/02/2016   Hypertension 10/02/2016    Allergies  Allergen Reactions   Valproic Acid Nausea And Vomiting    Past Surgical History:  Procedure Laterality Date   ABLATION  05/2015   KNEE ARTHROSCOPY Left 05/2009   MEDIAL PARTIAL KNEE REPLACEMENT Left 11/2009    Social History   Tobacco Use   Smoking status: Never   Smokeless tobacco: Never  Vaping Use   Vaping Use: Never used  Substance Use Topics   Alcohol use: Yes   Drug use: Never     Medication list has been reviewed and updated.  Current Meds  Medication Sig   Calcium Carbonate (CALTRATE 600 PO) Take 1 tablet by mouth daily.   conjugated estrogens (PREMARIN) vaginal cream Place 1 Applicatorful vaginally 2 (two) times a week. 1 gram vaginally at bedtime twice weekly   Cranberry 1000 MG CAPS Take 1 capsule by mouth daily.   metoprolol succinate (TOPROL-XL) 100 MG 24 hr tablet Take 1 tablet (100 mg total) by mouth daily. Take with or immediately following a meal.  naproxen (NAPROSYN) 500 MG tablet Take 1 tablet by mouth twice daily   omeprazole (PRILOSEC) 40 MG capsule Take 40 mg by mouth 2 (two) times daily. otc   triamcinolone cream (KENALOG) 0.1 % Apply 1 application topically 2 (two) times daily.       02/08/2022   10:56 AM 12/21/2021    2:41 PM 10/23/2021    8:41 AM 04/21/2021    9:31 AM  GAD 7 : Generalized Anxiety Score  Nervous, Anxious, on Edge 0 0 0 0  Control/stop worrying 0 0 0 0  Worry too much - different things 0 0 0 0  Trouble relaxing 0 0 0 0  Restless 0 0 0 0  Easily annoyed or irritable 0 0 0 0  Afraid - awful might happen 0 0 0 0   Total GAD 7 Score 0 0 0 0  Anxiety Difficulty Not difficult at all Not difficult at all Not difficult at all Not difficult at all       02/08/2022   10:56 AM 12/21/2021    2:41 PM 10/23/2021    8:41 AM  Depression screen PHQ 2/9  Decreased Interest 0 0 0  Down, Depressed, Hopeless 0 0 0  PHQ - 2 Score 0 0 0  Altered sleeping 0 0 0  Tired, decreased energy 0 0 0  Change in appetite 0 0 0  Feeling bad or failure about yourself  0 0 0  Trouble concentrating 0 0 0  Moving slowly or fidgety/restless 0 0 0  Suicidal thoughts 0 0 0  PHQ-9 Score 0 0 0  Difficult doing work/chores Not difficult at all Not difficult at all Not difficult at all    BP Readings from Last 3 Encounters:  02/08/22 122/64  12/21/21 128/78  10/23/21 120/78    Physical Exam Vitals and nursing note reviewed.  HENT:     Right Ear: Tympanic membrane and ear canal normal.     Left Ear: Tympanic membrane and ear canal normal.     Nose: Nose normal.     Right Sinus: No maxillary sinus tenderness or frontal sinus tenderness.     Left Sinus: No maxillary sinus tenderness or frontal sinus tenderness.     Mouth/Throat:     Mouth: Mucous membranes are moist.  Cardiovascular:     Heart sounds: No murmur heard.    No friction rub. No gallop.  Pulmonary:     Breath sounds: No wheezing or rhonchi.  Chest:     Chest wall: No tenderness.  Musculoskeletal:     Cervical back: Normal range of motion.  Neurological:     Mental Status: She is alert.     Wt Readings from Last 3 Encounters:  02/08/22 281 lb (127.5 kg)  12/21/21 291 lb (132 kg)  10/23/21 268 lb (121.6 kg)    BP 122/64   Pulse 80   Temp 98.1 F (36.7 C) (Oral)   Ht 5\' 8"  (1.727 m)   Wt 281 lb (127.5 kg)   SpO2 99%   BMI 42.73 kg/m   Assessment and Plan:  1. Flu-like symptoms Patient has been symptomatic for 10 days and has exposure to multiple individuals which may result in COVID or influenza exposure.  We we will check COVID and  influenza and it was noted to be negative. - POC COVID-19 - POCT Influenza A/B  2. Acute non-recurrent sinusitis, unspecified location Symptomatically it is suggesting that there is a sinusitis auscultation notes no pneumonia and there  is no tenderness over maxillary sinuses but I think that the sphenoid ethmoid may be involved and we will treat with azithromycin to 50 mg 2 today followed by 1 a day for 4 days. - azithromycin (ZITHROMAX) 250 MG tablet; Take 2 tablets on day 1, then 1 tablet daily on days 2 through 5  Dispense: 6 tablet; Refill: 0  3. Acute cough Patient has a significant cough particularly at night when she lays down there is nasal drainage that is purulent as well as CoughAssist.  We will treat with the above azithromycin but provide guaifenesin with codeine for control of the cough at night in particular.  During the day patient has been suggested to use Mucinex DM. - guaiFENesin-codeine (ROBITUSSIN AC) 100-10 MG/5ML syrup; Take 5 mLs by mouth 3 (three) times daily as needed for cough.  Dispense: 118 mL; Refill: 0    Otilio Miu, MD

## 2022-04-20 ENCOUNTER — Other Ambulatory Visit: Payer: Self-pay | Admitting: Family Medicine

## 2022-04-20 DIAGNOSIS — M1732 Unilateral post-traumatic osteoarthritis, left knee: Secondary | ICD-10-CM

## 2022-04-20 NOTE — Telephone Encounter (Signed)
Requested medications are due for refill today.  yes  Requested medications are on the active medications list.  yes  Last refill. 01/22/2022 #180 0 rf  Future visit scheduled.   yes  Notes to clinic.  Labs are expired.    Requested Prescriptions  Pending Prescriptions Disp Refills   naproxen (NAPROSYN) 500 MG tablet [Pharmacy Med Name: Naproxen 500 MG Oral Tablet] 180 tablet 0    Sig: Take 1 tablet by mouth twice daily     Analgesics:  NSAIDS Failed - 04/20/2022  9:20 AM      Failed - Manual Review: Labs are only required if the patient has taken medication for more than 8 weeks.      Failed - HGB in normal range and within 360 days    Hemoglobin  Date Value Ref Range Status  10/31/2018 12.1 11.1 - 15.9 g/dL Final         Failed - PLT in normal range and within 360 days    Platelets  Date Value Ref Range Status  10/31/2018 258 150 - 450 x10E3/uL Final         Failed - HCT in normal range and within 360 days    Hematocrit  Date Value Ref Range Status  10/31/2018 38.5 34.0 - 46.6 % Final         Passed - Cr in normal range and within 360 days    Creatinine, Ser  Date Value Ref Range Status  10/23/2021 0.85 0.57 - 1.00 mg/dL Final         Passed - eGFR is 30 or above and within 360 days    GFR calc Af Amer  Date Value Ref Range Status  01/05/2020 120 >59 mL/min/1.73 Final    Comment:    **In accordance with recommendations from the NKF-ASN Task force,**   [facility] is in the process of updating its eGFR calculation to the   2021 CKD-EPI creatinine equation that estimates kidney function   without a race variable.    GFR calc non Af Amer  Date Value Ref Range Status  01/05/2020 104 >59 mL/min/1.73 Final   eGFR  Date Value Ref Range Status  10/23/2021 83 >59 mL/min/1.73 Final         Passed - Patient is not pregnant      Passed - Valid encounter within last 12 months    Recent Outpatient Visits           2 months ago Flu-like symptoms   [place]  [facility]   4 months ago Urine frequency   [place] [facility]   5 months ago Essential hypertension   [place] [facility]   12 months ago Essential hypertension   [place] [facility]   1 year ago Post-traumatic osteoarthritis of left knee   [facility] at [facility], Ocie Bob, MD       Future Appointments             In 4 days Duanne Limerick, MD [facility] at [facility], [facility]

## 2022-04-24 ENCOUNTER — Encounter: Payer: Self-pay | Admitting: Family Medicine

## 2022-04-24 ENCOUNTER — Ambulatory Visit: Payer: BC Managed Care – PPO | Admitting: Family Medicine

## 2022-04-24 VITALS — BP 124/76 | HR 80 | Ht 68.0 in | Wt 286.0 lb

## 2022-04-24 DIAGNOSIS — I10 Essential (primary) hypertension: Secondary | ICD-10-CM | POA: Diagnosis not present

## 2022-04-24 DIAGNOSIS — M1732 Unilateral post-traumatic osteoarthritis, left knee: Secondary | ICD-10-CM | POA: Diagnosis not present

## 2022-04-24 DIAGNOSIS — R5383 Other fatigue: Secondary | ICD-10-CM | POA: Diagnosis not present

## 2022-04-24 MED ORDER — NAPROXEN 500 MG PO TABS: 500.0000 mg | ORAL_TABLET | Freq: Two times a day (BID) | ORAL | 1 refills | Status: DC

## 2022-04-24 MED ORDER — METOPROLOL SUCCINATE ER 100 MG PO TB24: 100.0000 mg | ORAL_TABLET | Freq: Every day | ORAL | 1 refills | Status: DC

## 2022-04-24 NOTE — Patient Instructions (Signed)

## 2022-04-24 NOTE — Progress Notes (Signed)
Date:  04/24/2022   Name:  Laura Christian   DOB:  [birth year]   MRN:  [id number]   Chief Complaint: Knee Pain (Uses naproxen for this), Hypertension, and Weight Gain  Knee Pain  The incident occurred more than 1 week ago. The pain is present in the left knee. The quality of the pain is described as aching. The pain is moderate. Pertinent negatives include no numbness. The symptoms are aggravated by movement. She has tried nothing for the symptoms. The treatment provided mild relief.  Hypertension This is a chronic problem. The current episode started more than 1 year ago. The problem has been gradually improving since onset. The problem is controlled. Associated symptoms include peripheral edema. Pertinent negatives include no chest pain, headaches, orthopnea, palpitations, PND or shortness of breath. Risk factors for coronary artery disease include dyslipidemia. Past treatments include beta blockers. There are no compliance problems.  There is no history of CAD/MI or CVA. Identifiable causes of hypertension include a thyroid problem. There is no history of chronic renal disease, a hypertension causing med or renovascular disease.  Thyroid Problem Presents for initial visit. Symptoms include diaphoresis, fatigue, heat intolerance, leg swelling and weight gain. Patient reports no anxiety, cold intolerance, constipation, depressed mood, diarrhea, dry skin, hair loss, menstrual problem, nail problem, palpitations, visual change or weight loss. Symptom course: episodic.    Lab Results  Component Value Date   NA 143 10/23/2021   K 4.6 10/23/2021   CO2 21 10/23/2021   GLUCOSE 91 10/23/2021   BUN 14 10/23/2021   CREATININE 0.85 10/23/2021   CALCIUM 9.5 10/23/2021   EGFR 83 10/23/2021   GFRNONAA 104 01/05/2020   Lab Results  Component Value Date   CHOL 195 10/23/2021   HDL 44 10/23/2021   LDLCALC 127 (H) 10/23/2021   TRIG 131 10/23/2021   CHOLHDL 4.3 05/13/2018   Lab Results   Component Value Date   TSH 2.430 10/31/2018   Lab Results  Component Value Date   HGBA1C 5.2 10/31/2018   Lab Results  Component Value Date   WBC 4.3 10/31/2018   HGB 12.1 10/31/2018   HCT 38.5 10/31/2018   MCV 87 10/31/2018   PLT 258 10/31/2018   Lab Results  Component Value Date   ALT 18 10/23/2021   AST 24 10/23/2021   ALKPHOS 113 10/23/2021   BILITOT 0.3 10/23/2021   No results found for: "25OHVITD2", "25OHVITD3", "VD25OH"   Review of Systems  Constitutional:  Positive for diaphoresis, fatigue, unexpected weight change and weight gain. Negative for weight loss.  HENT:  Negative for sinus pressure, sneezing, sore throat and trouble swallowing.   Eyes:  Negative for visual disturbance.  Respiratory:  Negative for cough, shortness of breath and wheezing.   Cardiovascular:  Negative for chest pain, palpitations, orthopnea and PND.  Gastrointestinal:  Negative for constipation and diarrhea.  Endocrine: Positive for heat intolerance. Negative for cold intolerance.  Genitourinary:  Negative for menstrual problem.  Skin:  Negative for color change.  Neurological:  Negative for numbness and headaches.  Psychiatric/Behavioral:  The patient is not nervous/anxious.     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/12/2020   DDD (degenerative disc disease), lumbar 04/22/2017   Uterine cramping 10/02/2016   Chronic anxiety 10/02/2016   Hypertension 10/02/2016    Allergies  Allergen Reactions   Valproic Acid Nausea And Vomiting  Past Surgical History:  Procedure Laterality Date   ABLATION  05/2015   KNEE ARTHROSCOPY Left 05/2009   MEDIAL PARTIAL KNEE REPLACEMENT Left 11/2009    Social History   Tobacco Use   Smoking status: Never   Smokeless tobacco: Never  Vaping Use   Vaping Use: Never used  Substance Use Topics   Alcohol use: Yes   Drug use:  Never     Medication list has been reviewed and updated.  Current Meds  Medication Sig   Calcium Carbonate (CALTRATE 600 PO) Take 1 tablet by mouth daily.   conjugated estrogens (PREMARIN) vaginal cream Place 1 Applicatorful vaginally 2 (two) times a week. 1 gram vaginally at bedtime twice weekly   Cranberry 1000 MG CAPS Take 1 capsule by mouth daily.   metoprolol succinate (TOPROL-XL) 100 MG 24 hr tablet Take 1 tablet (100 mg total) by mouth daily. Take with or immediately following a meal.   naproxen (NAPROSYN) 500 MG tablet Take 1 tablet by mouth twice daily   omeprazole (PRILOSEC) 40 MG capsule Take 40 mg by mouth 2 (two) times daily. otc   triamcinolone cream (KENALOG) 0.1 % Apply 1 application topically 2 (two) times daily.   [DISCONTINUED] guaiFENesin-codeine (ROBITUSSIN AC) 100-10 MG/5ML syrup Take 5 mLs by mouth 3 (three) times daily as needed for cough.       04/24/2022    9:19 AM 02/08/2022   10:56 AM 12/21/2021    2:41 PM 10/23/2021    8:41 AM  GAD 7 : Generalized Anxiety Score  Nervous, Anxious, on Edge 0 0 0 0  Control/stop worrying 0 0 0 0  Worry too much - different things 0 0 0 0  Trouble relaxing 0 0 0 0  Restless 0 0 0 0  Easily annoyed or irritable 0 0 0 0  Afraid - awful might happen 0 0 0 0  Total GAD 7 Score 0 0 0 0  Anxiety Difficulty Not difficult at all Not difficult at all Not difficult at all Not difficult at all       04/24/2022    9:19 AM 02/08/2022   10:56 AM 12/21/2021    2:41 PM  Depression screen PHQ 2/9  Decreased Interest 0 0 0  Down, Depressed, Hopeless 0 0 0  PHQ - 2 Score 0 0 0  Altered sleeping 0 0 0  Tired, decreased energy 0 0 0  Change in appetite 0 0 0  Feeling bad or failure about yourself  0 0 0  Trouble concentrating 0 0 0  Moving slowly or fidgety/restless 0 0 0  Suicidal thoughts 0 0 0  PHQ-9 Score 0 0 0  Difficult doing work/chores Not difficult at all Not difficult at all Not difficult at all    BP Readings from Last 3  Encounters:  04/24/22 124/76  02/08/22 122/64  12/21/21 128/78    Physical Exam Vitals and nursing note reviewed. Exam conducted with a chaperone present.  Constitutional:      General: She is not in acute distress.    Appearance: She is not diaphoretic.  HENT:     Head: Normocephalic and atraumatic.     Right Ear: Tympanic membrane and external ear normal.     Left Ear: External ear normal.     Nose: Nose normal.     Mouth/Throat:     Mouth: Mucous membranes are moist.  Eyes:     General:        Right eye: No discharge.  Left eye: No discharge.     Conjunctiva/sclera: Conjunctivae normal.     Pupils: Pupils are equal, round, and reactive to light.  Neck:     Thyroid: No thyromegaly.     Vascular: No JVD.  Cardiovascular:     Rate and Rhythm: Normal rate and regular rhythm.     Heart sounds: Normal heart sounds and S1 normal. No murmur heard.    No systolic murmur is present.     No diastolic murmur is present.     No friction rub. No gallop. No S3 or S4 sounds.  Pulmonary:     Effort: Pulmonary effort is normal.     Breath sounds: Normal breath sounds. No wheezing, rhonchi or rales.  Abdominal:     General: Bowel sounds are normal.     Palpations: Abdomen is soft. There is no mass.     Tenderness: There is no abdominal tenderness. There is no guarding.  Musculoskeletal:        General: Normal range of motion.     Cervical back: Normal range of motion and neck supple.     Right lower leg: No edema.     Left lower leg: No edema.  Lymphadenopathy:     Cervical: No cervical adenopathy.  Skin:    General: Skin is warm and dry.  Neurological:     Mental Status: She is alert.     Deep Tendon Reflexes: Reflexes are normal and symmetric.     Reflex Scores:      Patellar reflexes are 2+ on the right side and 2+ on the left side.    Wt Readings from Last 3 Encounters:  04/24/22 286 lb (129.7 kg)  02/08/22 281 lb (127.5 kg)  12/21/21 291 lb (132 kg)    BP  124/76   Pulse 80   Ht 5\' 8"  (1.727 m)   Wt 286 lb (129.7 kg)   SpO2 98%   BMI 43.49 kg/m   Assessment and Plan: 1. Essential hypertension Chronic.  Controlled.  Stable.  Blood pressure today 124/76.  Asymptomatic.  Tolerating medications well.  Continue metoprolol XL 100 mg once a day.  Will check renal function panel for electrolytes and GFR.  Will recheck patient in 6 months. - metoprolol succinate (TOPROL-XL) 100 MG 24 hr tablet; Take 1 tablet (100 mg total) by mouth daily. Take with or immediately following a meal.  Dispense: 90 tablet; Refill: 1 - Renal Function Panel  2. Post-traumatic osteoarthritis of left knee Chronic.  Relatively stable and reasonable mobility with naproxen 500 mg twice a day.  Will recheck in 6 months. - naproxen (NAPROSYN) 500 MG tablet; Take 1 tablet (500 mg total) by mouth 2 (two) times daily.  Dispense: 180 tablet; Refill: 1  3. Fatigue, unspecified type New onset.  Episodic.  Patient also has had unexpected weight gain.  Other issues of hypothyroid are positive on history of present illness.  Will check thyroid panel with TSH to rule out hypothyroid state. - Thyroid Panel With TSH     Elizabeth Sauereanna Kary Colaizzi, MD

## 2022-04-25 LAB — RENAL FUNCTION PANEL
Albumin: 4.1 g/dL (ref 3.8–4.9)
BUN/Creatinine Ratio: 26 — ABNORMAL HIGH (ref 9–23)
BUN: 19 mg/dL (ref 6–24)
CO2: 24 mmol/L (ref 20–29)
Calcium: 9.3 mg/dL (ref 8.7–10.2)
Chloride: 105 mmol/L (ref 96–106)
Creatinine, Ser: 0.74 mg/dL (ref 0.57–1.00)
Glucose: 81 mg/dL (ref 70–99)
Phosphorus: 4.1 mg/dL (ref 3.0–4.3)
Potassium: 5.2 mmol/L (ref 3.5–5.2)
Sodium: 143 mmol/L (ref 134–144)
eGFR: 98 mL/min/{1.73_m2} (ref >59)

## 2022-04-25 LAB — THYROID PANEL WITH TSH
Free Thyroxine Index: 2.2 (ref 1.2–4.9)
T3 Uptake Ratio: 28 % (ref 24–39)
T4, Total: 7.7 ug/dL (ref 4.5–12.0)
TSH: 2.29 u[IU]/mL (ref 0.450–4.500)

## 2022-07-31 ENCOUNTER — Telehealth: Payer: Self-pay | Admitting: Family Medicine

## 2022-07-31 ENCOUNTER — Telehealth: Payer: Self-pay

## 2022-07-31 NOTE — Telephone Encounter (Signed)
Pt requested Dr Justice Britain information. I sent it to her by [facility]

## 2022-07-31 NOTE — Telephone Encounter (Signed)
Copied from CRM [id number]. Topic: General - Other >> Jul 31, 2022 11:22 AM Patsy Lager T wrote: Reason for CRM: patient is requesting Delice Bison call her back with the name and phone number of the nutritionist that was givien to her a month ago

## 2022-08-30 ENCOUNTER — Encounter (INDEPENDENT_AMBULATORY_CARE_PROVIDER_SITE_OTHER): Payer: BC Managed Care – PPO | Admitting: Family Medicine

## 2022-09-18 ENCOUNTER — Encounter: Payer: BC Managed Care – PPO | Admitting: Family Medicine

## 2022-10-18 ENCOUNTER — Other Ambulatory Visit: Payer: Self-pay | Admitting: Family Medicine

## 2022-10-18 DIAGNOSIS — M1732 Unilateral post-traumatic osteoarthritis, left knee: Secondary | ICD-10-CM

## 2022-10-25 ENCOUNTER — Ambulatory Visit: Payer: BC Managed Care – PPO | Admitting: Family Medicine

## 2022-10-25 ENCOUNTER — Encounter: Payer: Self-pay | Admitting: Family Medicine

## 2022-10-25 VITALS — BP 122/78 | HR 81 | Ht 68.0 in | Wt 298.0 lb

## 2022-10-25 DIAGNOSIS — M1732 Unilateral post-traumatic osteoarthritis, left knee: Secondary | ICD-10-CM | POA: Diagnosis not present

## 2022-10-25 DIAGNOSIS — Z23 Encounter for immunization: Secondary | ICD-10-CM

## 2022-10-25 DIAGNOSIS — I10 Essential (primary) hypertension: Secondary | ICD-10-CM | POA: Diagnosis not present

## 2022-10-25 MED ORDER — NAPROXEN 500 MG PO TABS
500.0000 mg | ORAL_TABLET | Freq: Two times a day (BID) | ORAL | 0 refills | Status: DC
Start: 2022-10-25 — End: 2022-11-16

## 2022-10-25 MED ORDER — METOPROLOL SUCCINATE ER 100 MG PO TB24
100.0000 mg | ORAL_TABLET | Freq: Every day | ORAL | 1 refills | Status: DC
Start: 2022-10-25 — End: 2023-04-25

## 2022-10-25 NOTE — Progress Notes (Signed)
Date:  10/25/2022   Name:  Laura Christian   DOB:  1970-11-25   MRN:  [id number]   Chief Complaint: Hypertension  Hypertension This is a chronic problem. The current episode started more than 1 year ago. The problem has been gradually improving since onset. Pertinent negatives include no anxiety, blurred vision, chest pain, headaches, malaise/fatigue, neck pain, orthopnea, palpitations, peripheral edema, PND or shortness of breath. There are no associated agents to hypertension. There are no known risk factors for coronary artery disease. Past treatments include beta blockers. The current treatment provides moderate improvement. There are no compliance problems.  There is no history of CAD/MI or CVA. There is no history of chronic renal disease, a hypertension causing med or renovascular disease.    Lab Results  Component Value Date   NA 143 04/24/2022   K 5.2 04/24/2022   CO2 24 04/24/2022   GLUCOSE 81 04/24/2022   BUN 19 04/24/2022   CREATININE 0.74 04/24/2022   CALCIUM 9.3 04/24/2022   EGFR 98 04/24/2022   GFRNONAA 104 01/05/2020   Lab Results  Component Value Date   CHOL 195 10/23/2021   HDL 44 10/23/2021   LDLCALC 127 (H) 10/23/2021   TRIG 131 10/23/2021   CHOLHDL 4.3 05/13/2018   Lab Results  Component Value Date   TSH 2.290 04/24/2022   Lab Results  Component Value Date   HGBA1C 5.2 10/31/2018   Lab Results  Component Value Date   WBC 4.3 10/31/2018   HGB 12.1 10/31/2018   HCT 38.5 10/31/2018   MCV 87 10/31/2018   PLT 258 10/31/2018   Lab Results  Component Value Date   ALT 18 10/23/2021   AST 24 10/23/2021   ALKPHOS 113 10/23/2021   BILITOT 0.3 10/23/2021   No results found for: "25OHVITD2", "25OHVITD3", "VD25OH"   Review of Systems  Constitutional:  Negative for fever, malaise/fatigue and unexpected weight change.  HENT:  Negative for trouble swallowing.   Eyes:  Negative for blurred vision.  Respiratory:  Negative for cough, chest tightness,  shortness of breath and wheezing.   Cardiovascular:  Negative for chest pain, palpitations, orthopnea, leg swelling and PND.  Gastrointestinal:  Negative for abdominal distention and blood in stool.  Genitourinary:  Negative for difficulty urinating, hematuria and menstrual problem.  Musculoskeletal:  Positive for arthralgias. Negative for neck pain.  Neurological:  Negative for headaches.    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/12/2020   DDD (degenerative disc disease), lumbar 04/22/2017   Uterine cramping 10/02/2016   Chronic anxiety 10/02/2016   Hypertension 10/02/2016    Allergies  Allergen Reactions   Divalproex Sodium Nausea And Vomiting   Valproic Acid Nausea And Vomiting    Past Surgical History:  Procedure Laterality Date   ABLATION  05/2015   KNEE ARTHROSCOPY Left 05/2009   MEDIAL PARTIAL KNEE REPLACEMENT Left 11/2009    Social History   Tobacco Use   Smoking status: Never   Smokeless tobacco: Never  Vaping Use   Vaping status: Never Used  Substance Use Topics   Alcohol use: Yes   Drug use: Never     Medication list has been reviewed and updated.  Current Meds  Medication Sig   Calcium Carbonate (CALTRATE 600 PO) Take 1 tablet by mouth daily.   conjugated estrogens (PREMARIN) vaginal cream Place 1 Applicatorful vaginally 2 (two) times a week. 1  gram vaginally at bedtime twice weekly   Cranberry 1000 MG CAPS Take 1 capsule by mouth daily.   metoprolol succinate (TOPROL-XL) 100 MG 24 hr tablet Take 1 tablet (100 mg total) by mouth daily. Take with or immediately following a meal.   naproxen (NAPROSYN) 500 MG tablet Take 1 tablet by mouth twice daily   omeprazole (PRILOSEC) 40 MG capsule Take 40 mg by mouth 2 (two) times daily. otc   triamcinolone cream (KENALOG) 0.1 % Apply 1 application topically 2 (two) times daily.       10/25/2022     9:11 AM 04/24/2022    9:19 AM 02/08/2022   10:56 AM 12/21/2021    2:41 PM  GAD 7 : Generalized Anxiety Score  Nervous, Anxious, on Edge 0 0 0 0  Control/stop worrying 0 0 0 0  Worry too much - different things 0 0 0 0  Trouble relaxing 0 0 0 0  Restless 0 0 0 0  Easily annoyed or irritable 0 0 0 0  Afraid - awful might happen 0 0 0 0  Total GAD 7 Score 0 0 0 0  Anxiety Difficulty Not difficult at all Not difficult at all Not difficult at all Not difficult at all       10/25/2022    9:11 AM 04/24/2022    9:19 AM 02/08/2022   10:56 AM  Depression screen PHQ 2/9  Decreased Interest 0 0 0  Down, Depressed, Hopeless 0 0 0  PHQ - 2 Score 0 0 0  Altered sleeping 0 0 0  Tired, decreased energy 0 0 0  Change in appetite 0 0 0  Feeling bad or failure about yourself  0 0 0  Trouble concentrating 0 0 0  Moving slowly or fidgety/restless 0 0 0  Suicidal thoughts 0 0 0  PHQ-9 Score 0 0 0  Difficult doing work/chores Not difficult at all Not difficult at all Not difficult at all    BP Readings from Last 3 Encounters:  10/25/22 122/78  04/24/22 124/76  02/08/22 122/64    Physical Exam Vitals and nursing note reviewed. Exam conducted with a chaperone present.  Constitutional:      General: She is not in acute distress.    Appearance: She is not diaphoretic.  HENT:     Head: Normocephalic and atraumatic.     Right Ear: Tympanic membrane, ear canal and external ear normal.     Left Ear: Tympanic membrane, ear canal and external ear normal.     Nose: Nose normal.     Mouth/Throat:     Mouth: Mucous membranes are moist.  Eyes:     General:        Right eye: No discharge.        Left eye: No discharge.     Conjunctiva/sclera: Conjunctivae normal.     Pupils: Pupils are equal, round, and reactive to light.  Neck:     Thyroid: No thyromegaly.     Vascular: No JVD.  Cardiovascular:     Rate and Rhythm: Normal rate and regular rhythm.     Heart sounds: Normal heart sounds. No  murmur heard.    No friction rub. No gallop.  Pulmonary:     Effort: Pulmonary effort is normal.     Breath sounds: Normal breath sounds. No wheezing, rhonchi or rales.  Abdominal:     General: Bowel sounds are normal.     Palpations: Abdomen is soft. There is no mass.  Tenderness: There is no abdominal tenderness. There is no guarding or rebound.  Musculoskeletal:        General: Normal range of motion.     Cervical back: Normal range of motion and neck supple.  Lymphadenopathy:     Cervical: No cervical adenopathy.  Skin:    General: Skin is warm and dry.     Findings: No bruising, erythema or lesion.  Neurological:     Mental Status: She is alert.     Deep Tendon Reflexes: Reflexes are normal and symmetric.     Wt Readings from Last 3 Encounters:  10/25/22 298 lb (135.2 kg)  04/24/22 286 lb (129.7 kg)  02/08/22 281 lb (127.5 kg)    BP 122/78   Pulse 81   Ht 5\' 8"  (1.727 m)   Wt 298 lb (135.2 kg)   SpO2 94%   BMI 45.31 kg/m   Assessment and Plan: 1. Essential hypertension Chronic.  Controlled.  Stable.  Blood pressure 122/78.  Asymptomatic.  Tolerating medication well. - metoprolol succinate (TOPROL-XL) 100 MG 24 hr tablet; Take 1 tablet (100 mg total) by mouth daily. Take with or immediately following a meal.  Dispense: 90 tablet; Refill: 1  2. Post-traumatic osteoarthritis of left knee Chronic.  Controlled.  Stable.  Continue naproxen 500 mg once twice a day as needed for flareups. - naproxen (NAPROSYN) 500 MG tablet; Take 1 tablet (500 mg total) by mouth 2 (two) times daily.  Dispense: 30 tablet; Refill: 0  3. Need for influenza vaccination Discussed and administered - Flu vaccine trivalent PF, 6mos and older(Flulaval,Afluria,Fluarix,Fluzone)     Elizabeth Sauer, MD

## 2022-11-16 ENCOUNTER — Other Ambulatory Visit: Payer: Self-pay | Admitting: Family Medicine

## 2022-11-16 DIAGNOSIS — M1732 Unilateral post-traumatic osteoarthritis, left knee: Secondary | ICD-10-CM

## 2022-11-16 NOTE — Telephone Encounter (Signed)
Requested medication (s) are due for refill today:   Yes  Requested medication (s) are on the active medication list:   Yes  Future visit scheduled:   Yes 04/25/2023   Last ordered: 10/25/2022 #30, 0 refills  Unable to refill because H & H and platelets due per protocol.   Last done in 2020.   Requested Prescriptions  Pending Prescriptions Disp Refills   naproxen (NAPROSYN) 500 MG tablet [Pharmacy Med Name: Naproxen 500 MG Oral Tablet] 30 tablet 0    Sig: Take 1 tablet by mouth twice daily     Analgesics:  NSAIDS Failed - 11/16/2022  6:53 AM      Failed - Manual Review: Labs are only required if the patient has taken medication for more than 8 weeks.      Failed - HGB in normal range and within 360 days    Hemoglobin  Date Value Ref Range Status  10/31/2018 12.1 11.1 - 15.9 g/dL Final         Failed - PLT in normal range and within 360 days    Platelets  Date Value Ref Range Status  10/31/2018 258 150 - 450 x10E3/uL Final         Failed - HCT in normal range and within 360 days    Hematocrit  Date Value Ref Range Status  10/31/2018 38.5 34.0 - 46.6 % Final         Passed - Cr in normal range and within 360 days    Creatinine, Ser  Date Value Ref Range Status  04/24/2022 0.74 0.57 - 1.00 mg/dL Final         Passed - eGFR is 30 or above and within 360 days    GFR calc Af Amer  Date Value Ref Range Status  01/05/2020 120 >59 mL/min/1.73 Final    Comment:    **In accordance with recommendations from the NKF-ASN Task force,**   [facility] is in the process of updating its eGFR calculation to the   2021 CKD-EPI creatinine equation that estimates kidney function   without a race variable.    GFR calc non Af Amer  Date Value Ref Range Status  01/05/2020 104 >59 mL/min/1.73 Final   eGFR  Date Value Ref Range Status  04/24/2022 98 >59 mL/min/1.73 Final         Passed - Patient is not pregnant      Passed - Valid encounter within last 12 months    Recent Outpatient  Visits           3 weeks ago Essential hypertension   [place] [facility]   6 months ago Essential hypertension   [place] [facility]   9 months ago Flu-like symptoms   [place] [facility]   11 months ago Urine frequency   [place] [facility]   1 year ago Essential hypertension   [place] [facility]       Future Appointments             In 5 months Duanne Limerick, MD [facility] at [facility], [facility]

## 2022-12-14 ENCOUNTER — Other Ambulatory Visit: Payer: Self-pay | Admitting: Family Medicine

## 2022-12-14 DIAGNOSIS — M1732 Unilateral post-traumatic osteoarthritis, left knee: Secondary | ICD-10-CM

## 2022-12-31 ENCOUNTER — Other Ambulatory Visit: Payer: Self-pay | Admitting: Family Medicine

## 2022-12-31 DIAGNOSIS — M1732 Unilateral post-traumatic osteoarthritis, left knee: Secondary | ICD-10-CM

## 2023-01-15 ENCOUNTER — Other Ambulatory Visit: Payer: Self-pay | Admitting: Family Medicine

## 2023-01-15 DIAGNOSIS — M1732 Unilateral post-traumatic osteoarthritis, left knee: Secondary | ICD-10-CM

## 2023-01-29 ENCOUNTER — Other Ambulatory Visit: Payer: Self-pay | Admitting: Family Medicine

## 2023-01-29 DIAGNOSIS — M1732 Unilateral post-traumatic osteoarthritis, left knee: Secondary | ICD-10-CM

## 2023-02-01 ENCOUNTER — Other Ambulatory Visit: Payer: Self-pay | Admitting: Family Medicine

## 2023-02-01 DIAGNOSIS — M1732 Unilateral post-traumatic osteoarthritis, left knee: Secondary | ICD-10-CM

## 2023-02-01 NOTE — Telephone Encounter (Signed)
Request is too soon, last refill 01/29/23.  Requested Prescriptions  Pending Prescriptions Disp Refills   naproxen (NAPROSYN) 500 MG tablet [Pharmacy Med Name: Naproxen 500 MG Oral Tablet] 30 tablet 0    Sig: Take 1 tablet by mouth twice daily     Analgesics:  NSAIDS Failed - 02/01/2023 10:31 AM      Failed - Manual Review: Labs are only required if the patient has taken medication for more than 8 weeks.      Failed - HGB in normal range and within 360 days    Hemoglobin  Date Value Ref Range Status  10/31/2018 12.1 11.1 - 15.9 g/dL Final         Failed - PLT in normal range and within 360 days    Platelets  Date Value Ref Range Status  10/31/2018 258 150 - 450 x10E3/uL Final         Failed - HCT in normal range and within 360 days    Hematocrit  Date Value Ref Range Status  10/31/2018 38.5 34.0 - 46.6 % Final         Passed - Cr in normal range and within 360 days    Creatinine, Ser  Date Value Ref Range Status  04/24/2022 0.74 0.57 - 1.00 mg/dL Final         Passed - eGFR is 30 or above and within 360 days    GFR calc Af Amer  Date Value Ref Range Status  01/05/2020 120 >59 mL/min/1.73 Final    Comment:    **In accordance with recommendations from the NKF-ASN Task force,**   [facility] is in the process of updating its eGFR calculation to the   2021 CKD-EPI creatinine equation that estimates kidney function   without a race variable.    GFR calc non Af Amer  Date Value Ref Range Status  01/05/2020 104 >59 mL/min/1.73 Final   eGFR  Date Value Ref Range Status  04/24/2022 98 >59 mL/min/1.73 Final         Passed - Patient is not pregnant      Passed - Valid encounter within last 12 months    Recent Outpatient Visits           3 months ago Essential hypertension   [place] [facility]   9 months ago Essential hypertension   [place] [facility]   11 months ago Flu-like symptoms   [place] [facility]   1 year ago Urine frequency   [place] [facility]   1 year ago Essential hypertension   [place] [facility]       Future Appointments             In 2 months Duanne Limerick, MD [facility] at [facility], [facility]

## 2023-02-17 ENCOUNTER — Other Ambulatory Visit: Payer: Self-pay | Admitting: Family Medicine

## 2023-02-17 DIAGNOSIS — M1732 Unilateral post-traumatic osteoarthritis, left knee: Secondary | ICD-10-CM

## 2023-03-05 ENCOUNTER — Other Ambulatory Visit: Payer: Self-pay | Admitting: Family Medicine

## 2023-03-05 DIAGNOSIS — M1732 Unilateral post-traumatic osteoarthritis, left knee: Secondary | ICD-10-CM

## 2023-03-06 IMAGING — CR DG KNEE COMPLETE 4+V*L*
4 series · 4 of 4 positions shown · non-contrast
Comparison: None.

CLINICAL DATA: 49-year-old female with trauma to the left knee.

EXAM:
LEFT KNEE - COMPLETE 4+ VIEW

[knee ap]
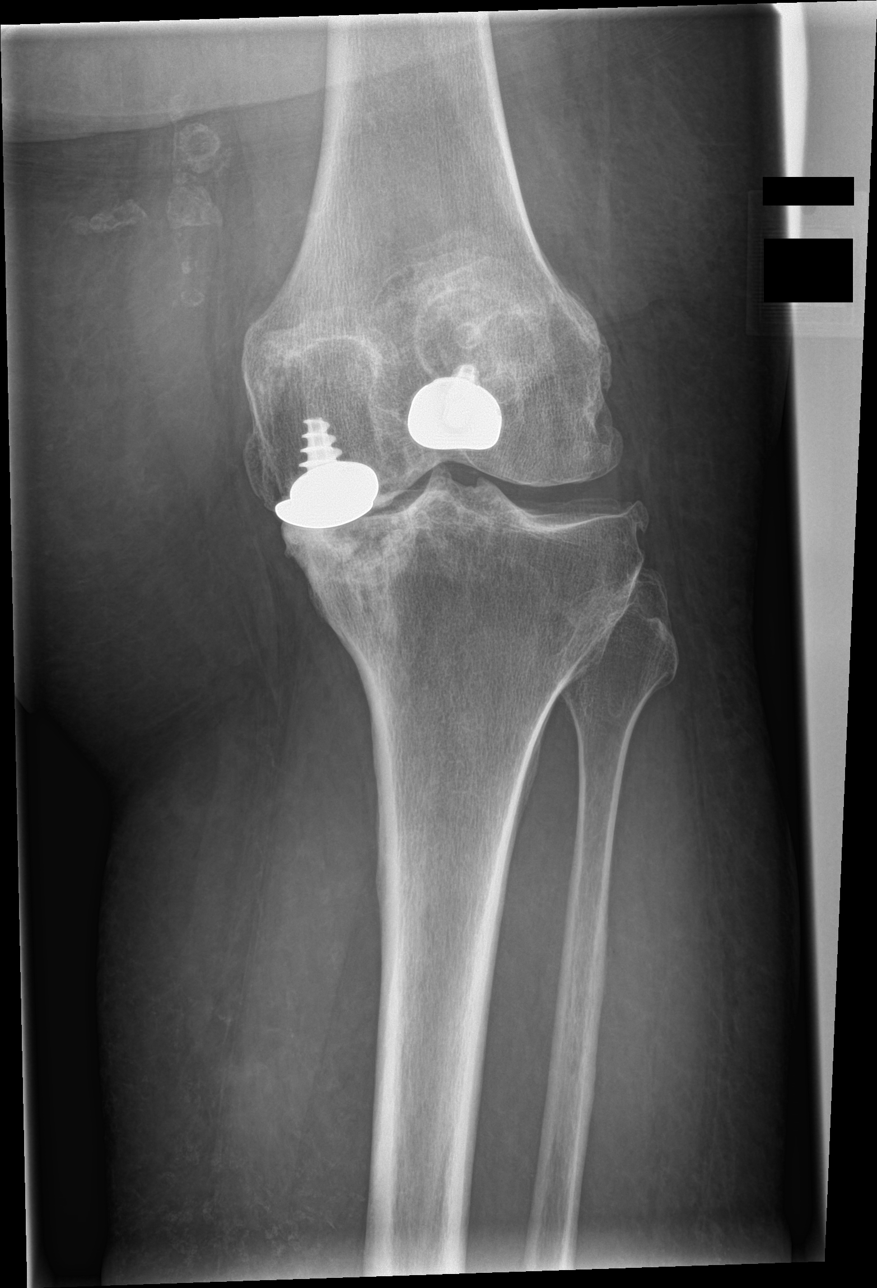

[knee lat]
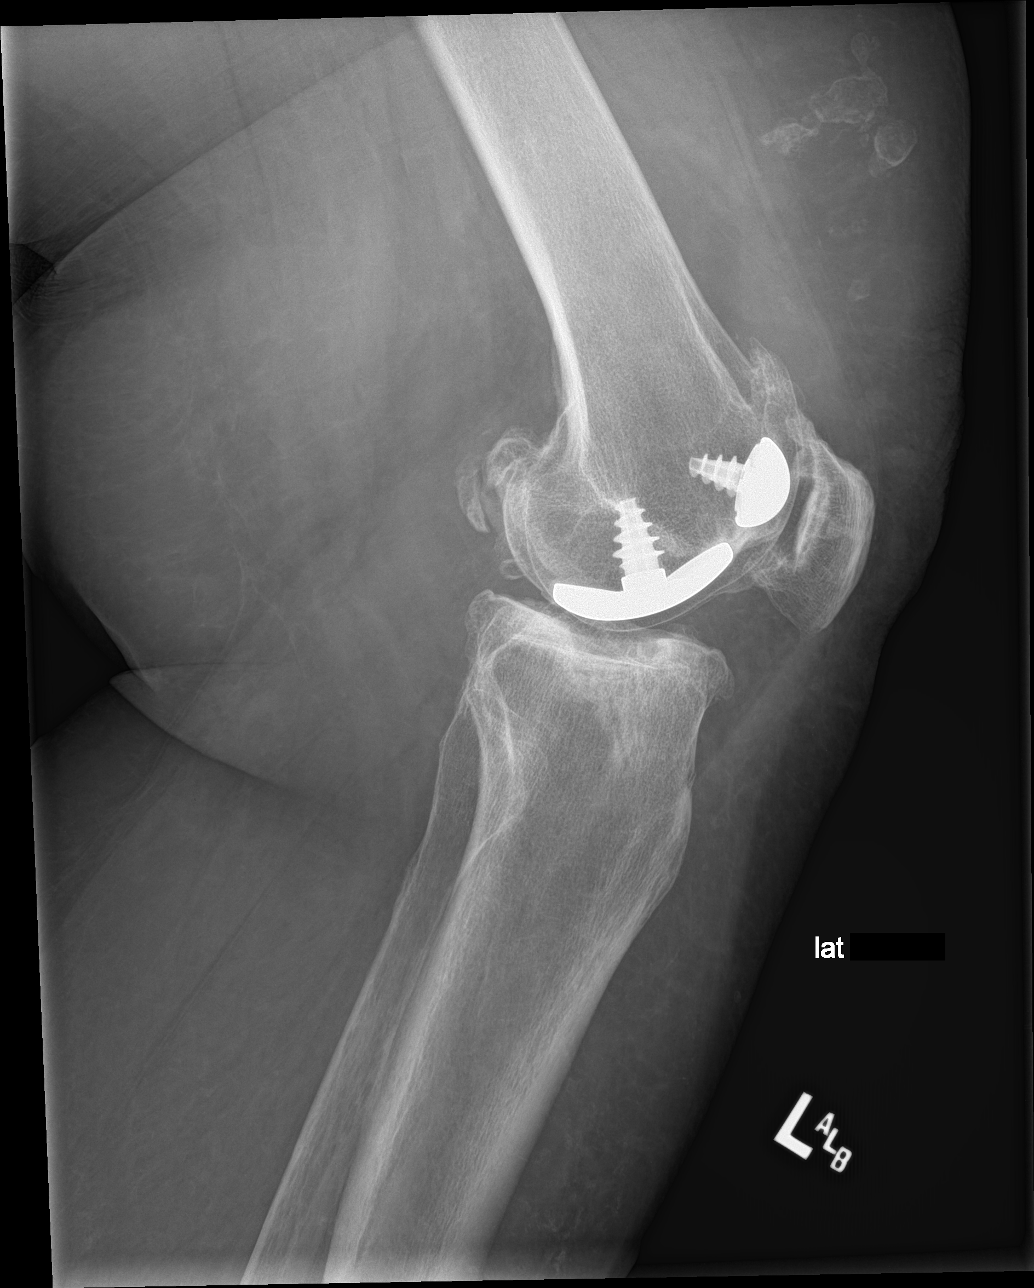

[tunnel]
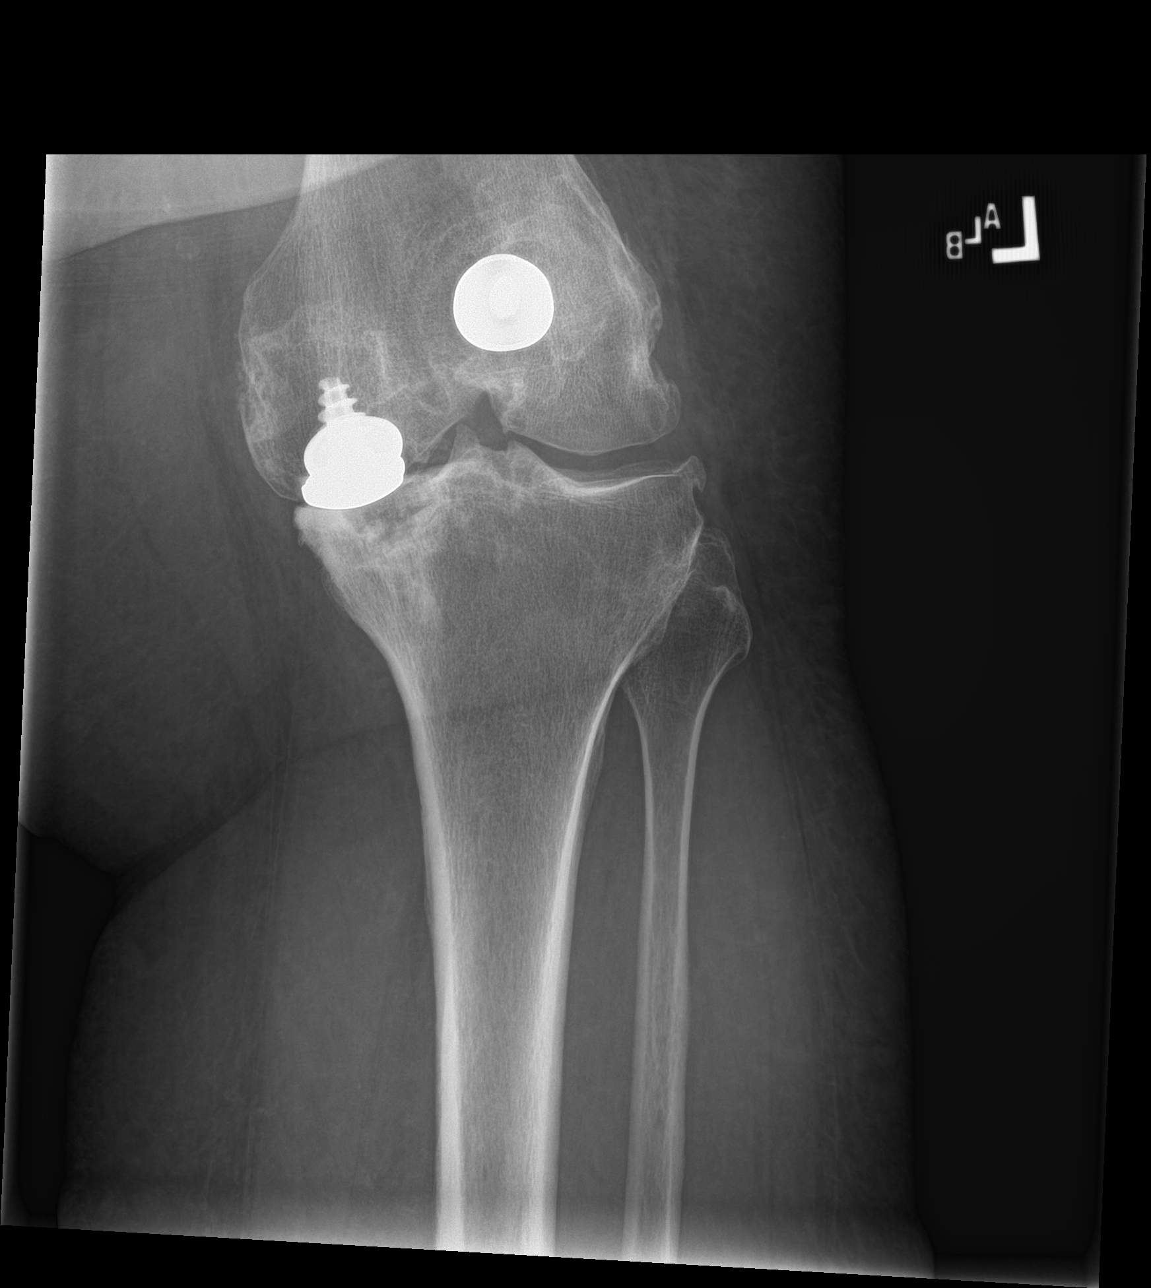

[patella skyline]
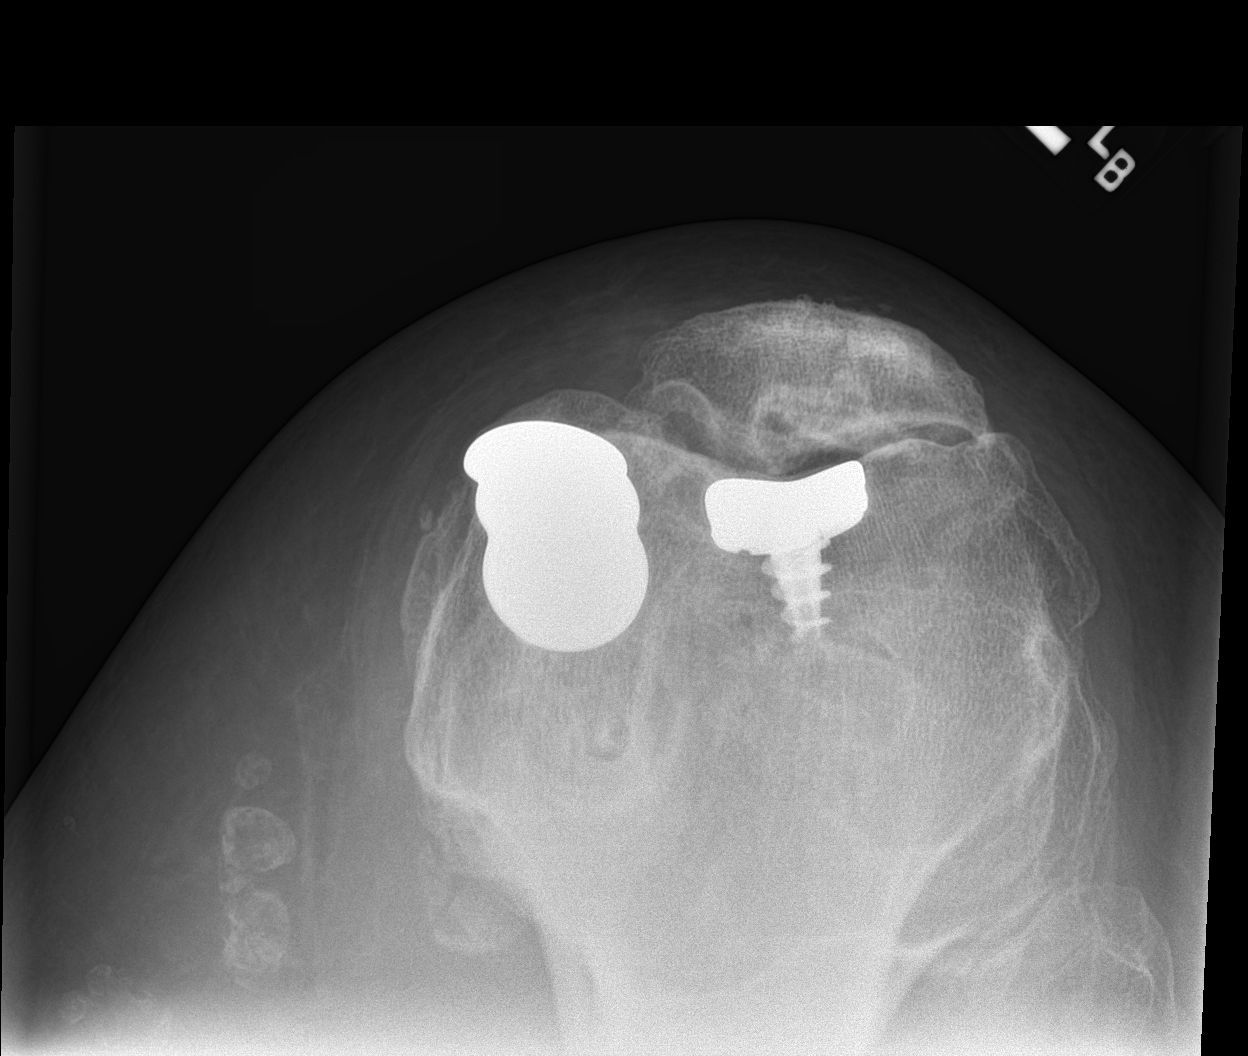

[4 of 4 positions shown; findings below may reference images not displayed]

FINDINGS: There is no acute fracture or dislocation. There is osteopenia with
severe arthritic changes and severe narrowing of the medial
compartment. Chronic appearing sclerotic changes with subcortical
cyst along the medial tibial plateau. Postsurgical changes and
screws of the distal femur. The hardware is intact. No joint
effusion. The soft tissues are unremarkable.
IMPRESSION: 1. No acute fracture or dislocation.
2. Osteopenia with severe osteoarthritic changes.

## 2023-03-08 ENCOUNTER — Other Ambulatory Visit: Payer: Self-pay | Admitting: Family Medicine

## 2023-03-08 DIAGNOSIS — M1732 Unilateral post-traumatic osteoarthritis, left knee: Secondary | ICD-10-CM

## 2023-03-08 NOTE — Telephone Encounter (Signed)
 naproxen (NAPROSYN) 500 MG tablet 30 tablet 0 03/05/2023 --   Sig - Route: Take 1 tablet by mouth twice daily - Oral   Sent to pharmacy as: naproxen (NAPROSYN) 500 MG tablet   E-Prescribing Status: Receipt confirmed by pharmacy (03/05/2023  3:40 PM EST)     Requested Prescriptions  Pending Prescriptions Disp Refills   naproxen (NAPROSYN) 500 MG tablet [Pharmacy Med Name: Naproxen 500 MG Oral Tablet] 30 tablet 0    Sig: Take 1 tablet by mouth twice daily     Analgesics:  NSAIDS Failed - 03/08/2023  4:08 PM      Failed - Manual Review: Labs are only required if the patient has taken medication for more than 8 weeks.      Failed - HGB in normal range and within 360 days    Hemoglobin  Date Value Ref Range Status  10/31/2018 12.1 11.1 - 15.9 g/dL Final         Failed - PLT in normal range and within 360 days    Platelets  Date Value Ref Range Status  10/31/2018 258 150 - 450 x10E3/uL Final         Failed - HCT in normal range and within 360 days    Hematocrit  Date Value Ref Range Status  10/31/2018 38.5 34.0 - 46.6 % Final         Passed - Cr in normal range and within 360 days    Creatinine, Ser  Date Value Ref Range Status  04/24/2022 0.74 0.57 - 1.00 mg/dL Final         Passed - eGFR is 30 or above and within 360 days    GFR calc Af Amer  Date Value Ref Range Status  01/05/2020 120 >59 mL/min/1.73 Final    Comment:    **In accordance with recommendations from the NKF-ASN Task force,**   [facility] is in the process of updating its eGFR calculation to the   2021 CKD-EPI creatinine equation that estimates kidney function   without a race variable.    GFR calc non Af Amer  Date Value Ref Range Status  01/05/2020 104 >59 mL/min/1.73 Final   eGFR  Date Value Ref Range Status  04/24/2022 98 >59 mL/min/1.73 Final         Passed - Patient is not pregnant      Passed - Valid encounter within last 12 months    Recent Outpatient Visits           4 months ago Essential  hypertension   [place] [facility]   10 months ago Essential hypertension   [place]  [facility]   1 year ago Flu-like symptoms   [place] [facility]   1 year ago Urine frequency   [place] [facility]   1 year ago Essential hypertension   [place] [facility]       Future Appointments             In 1 month Duanne Limerick, MD [facility] at [facility], [facility]

## 2023-03-22 ENCOUNTER — Other Ambulatory Visit: Payer: Self-pay | Admitting: Family Medicine

## 2023-03-22 DIAGNOSIS — M1732 Unilateral post-traumatic osteoarthritis, left knee: Secondary | ICD-10-CM

## 2023-03-22 NOTE — Telephone Encounter (Signed)
 Requested medications are due for refill today.  yes  Requested medications are on the active medications list.  yes  Last refill. 03/05/2023 #30 0 rf  Future visit scheduled.   yes  Notes to clinic.  Expired labs.    Requested Prescriptions  Pending Prescriptions Disp Refills   naproxen (NAPROSYN) 500 MG tablet [Pharmacy Med Name: Naproxen 500 MG Oral Tablet] 30 tablet 0    Sig: Take 1 tablet by mouth twice daily     Analgesics:  NSAIDS Failed - 03/22/2023  2:21 PM      Failed - Manual Review: Labs are only required if the patient has taken medication for more than 8 weeks.      Failed - HGB in normal range and within 360 days    Hemoglobin  Date Value Ref Range Status  10/31/2018 12.1 11.1 - 15.9 g/dL Final         Failed - PLT in normal range and within 360 days    Platelets  Date Value Ref Range Status  10/31/2018 258 150 - 450 x10E3/uL Final         Failed - HCT in normal range and within 360 days    Hematocrit  Date Value Ref Range Status  10/31/2018 38.5 34.0 - 46.6 % Final         Passed - Cr in normal range and within 360 days    Creatinine, Ser  Date Value Ref Range Status  04/24/2022 0.74 0.57 - 1.00 mg/dL Final         Passed - eGFR is 30 or above and within 360 days    GFR calc Af Amer  Date Value Ref Range Status  01/05/2020 120 >59 mL/min/1.73 Final    Comment:    **In accordance with recommendations from the NKF-ASN Task force,**   [facility] is in the process of updating its eGFR calculation to the   2021 CKD-EPI creatinine equation that estimates kidney function   without a race variable.    GFR calc non Af Amer  Date Value Ref Range Status  01/05/2020 104 >59 mL/min/1.73 Final   eGFR  Date Value Ref Range Status  04/24/2022 98 >59 mL/min/1.73 Final         Passed - Patient is not pregnant      Passed - Valid encounter within last 12 months    Recent Outpatient Visits           4 months ago Essential hypertension   [place]  [facility]   11 months ago Essential hypertension   [place] [facility]   1 year ago Flu-like symptoms   [place] [facility]   1 year ago Urine frequency   [place] [facility]   1 year ago Essential hypertension   [place] [facility]       Future Appointments             In 1 month Duanne Limerick, MD [facility] at [facility], [facility]

## 2023-04-08 ENCOUNTER — Other Ambulatory Visit: Payer: Self-pay | Admitting: Family Medicine

## 2023-04-08 DIAGNOSIS — M1732 Unilateral post-traumatic osteoarthritis, left knee: Secondary | ICD-10-CM

## 2023-04-09 NOTE — Telephone Encounter (Signed)
 Requested medication (s) are due for refill today: no  Requested medication (s) are on the active medication list: yes  Last refill:  03/22/23 #30  Future visit scheduled: yes  Notes to clinic:  pt labs are out of date   Requested Prescriptions  Pending Prescriptions Disp Refills   naproxen (NAPROSYN) 500 MG tablet [Pharmacy Med Name: Naproxen 500 MG Oral Tablet] 30 tablet 0    Sig: Take 1 tablet by mouth twice daily     Analgesics:  NSAIDS Failed - 04/09/2023 11:16 AM      Failed - Manual Review: Labs are only required if the patient has taken medication for more than 8 weeks.      Failed - HGB in normal range and within 360 days    Hemoglobin  Date Value Ref Range Status  10/31/2018 12.1 11.1 - 15.9 g/dL Final         Failed - PLT in normal range and within 360 days    Platelets  Date Value Ref Range Status  10/31/2018 258 150 - 450 x10E3/uL Final         Failed - HCT in normal range and within 360 days    Hematocrit  Date Value Ref Range Status  10/31/2018 38.5 34.0 - 46.6 % Final         Passed - Cr in normal range and within 360 days    Creatinine, Ser  Date Value Ref Range Status  04/24/2022 0.74 0.57 - 1.00 mg/dL Final         Passed - eGFR is 30 or above and within 360 days    GFR calc Af Amer  Date Value Ref Range Status  01/05/2020 120 >59 mL/min/1.73 Final    Comment:    **In accordance with recommendations from the NKF-ASN Task force,**   [facility] is in the process of updating its eGFR calculation to the   2021 CKD-EPI creatinine equation that estimates kidney function   without a race variable.    GFR calc non Af Amer  Date Value Ref Range Status  01/05/2020 104 >59 mL/min/1.73 Final   eGFR  Date Value Ref Range Status  04/24/2022 98 >59 mL/min/1.73 Final         Passed - Patient is not pregnant      Passed - Valid encounter within last 12 months    Recent Outpatient Visits           5 months ago Essential hypertension   [place]  [facility]   11 months ago Essential hypertension   [place] [facility]   1 year ago Flu-like symptoms   [place] [facility]   1 year ago Urine frequency   [place] [facility]   1 year ago Essential hypertension   [place] [facility]       Future Appointments             In 2 weeks Duanne Limerick, MD [facility] at [facility], [facility]

## 2023-04-11 ENCOUNTER — Other Ambulatory Visit: Payer: Self-pay | Admitting: Family Medicine

## 2023-04-11 DIAGNOSIS — M1732 Unilateral post-traumatic osteoarthritis, left knee: Secondary | ICD-10-CM

## 2023-04-22 ENCOUNTER — Other Ambulatory Visit: Payer: Self-pay | Admitting: Family Medicine

## 2023-04-22 DIAGNOSIS — M1732 Unilateral post-traumatic osteoarthritis, left knee: Secondary | ICD-10-CM

## 2023-04-25 ENCOUNTER — Encounter: Payer: Self-pay | Admitting: Family Medicine

## 2023-04-25 ENCOUNTER — Ambulatory Visit: Payer: Self-pay | Admitting: Family Medicine

## 2023-04-25 VITALS — BP 128/76 | HR 71 | Ht 68.0 in | Wt 285.2 lb

## 2023-04-25 DIAGNOSIS — L309 Dermatitis, unspecified: Secondary | ICD-10-CM

## 2023-04-25 DIAGNOSIS — I10 Essential (primary) hypertension: Secondary | ICD-10-CM

## 2023-04-25 DIAGNOSIS — M1732 Unilateral post-traumatic osteoarthritis, left knee: Secondary | ICD-10-CM | POA: Diagnosis not present

## 2023-04-25 MED ORDER — NAPROXEN 500 MG PO TABS: 500.0000 mg | ORAL_TABLET | Freq: Two times a day (BID) | ORAL | 1 refills | Status: DC

## 2023-04-25 MED ORDER — TRIAMCINOLONE ACETONIDE 0.1 % EX CREA: 1.0000 | TOPICAL_CREAM | Freq: Two times a day (BID) | CUTANEOUS | 1 refills | Status: AC

## 2023-04-25 MED ORDER — METOPROLOL SUCCINATE ER 100 MG PO TB24
100.0000 mg | ORAL_TABLET | Freq: Every day | ORAL | 1 refills | Status: DC
Start: 2023-04-25 — End: 2023-11-12

## 2023-04-25 NOTE — Progress Notes (Signed)
 Date:  04/25/2023   Name:  Laura Christian   DOB:  1970/08/31   MRN:  [id number]   Chief Complaint: Hypertension and Rash (Patient said she has a rash, blister on her knuckle, both hands, worse on right hand)  Hypertension This is a chronic problem. The current episode started more than 1 year ago. The problem has been gradually improving since onset. The problem is controlled. Pertinent negatives include no anxiety, blurred vision, chest pain, headaches, orthopnea, palpitations, peripheral edema, PND or shortness of breath. There are no associated agents to hypertension. There are no known risk factors for coronary artery disease. Past treatments include beta blockers. The current treatment provides moderate improvement. There are no compliance problems.  There is no history of CAD/MI or CVA. There is no history of chronic renal disease, a hypertension causing med or renovascular disease.  Rash Pertinent negatives include no congestion, cough or shortness of breath.  Diaper Rash This is a recurrent problem. The current episode started more than 1 year ago. The problem occurs intermittently. Associated symptoms include a rash. Pertinent negatives include no abdominal pain, chest pain, congestion, coughing, headaches, joint swelling, numbness or visual change. Nothing aggravates the symptoms. She has tried nothing for the symptoms. The treatment provided mild relief.    Lab Results  Component Value Date   NA 143 04/24/2022   K 5.2 04/24/2022   CO2 24 04/24/2022   GLUCOSE 81 04/24/2022   BUN 19 04/24/2022   CREATININE 0.74 04/24/2022   CALCIUM 9.3 04/24/2022   EGFR 98 04/24/2022   GFRNONAA 104 01/05/2020   Lab Results  Component Value Date   CHOL 195 10/23/2021   HDL 44 10/23/2021   LDLCALC 127 (H) 10/23/2021   TRIG 131 10/23/2021   CHOLHDL 4.3 05/13/2018   Lab Results  Component Value Date   TSH 2.290 04/24/2022   Lab Results  Component Value Date   HGBA1C 5.2 10/31/2018    Lab Results  Component Value Date   WBC 4.3 10/31/2018   HGB 12.1 10/31/2018   HCT 38.5 10/31/2018   MCV 87 10/31/2018   PLT 258 10/31/2018   Lab Results  Component Value Date   ALT 18 10/23/2021   AST 24 10/23/2021   ALKPHOS 113 10/23/2021   BILITOT 0.3 10/23/2021   No results found for: "25OHVITD2", "25OHVITD3", "VD25OH"   Review of Systems  HENT:  Negative for congestion.   Eyes:  Negative for blurred vision and visual disturbance.  Respiratory:  Negative for cough, choking, shortness of breath, wheezing and stridor.   Cardiovascular:  Negative for chest pain, palpitations, orthopnea, leg swelling and PND.  Gastrointestinal:  Negative for abdominal distention, abdominal pain and anal bleeding.  Genitourinary:  Negative for vaginal pain.  Musculoskeletal:  Negative for joint swelling.  Skin:  Positive for rash.  Neurological:  Negative for numbness and headaches.    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/12/2020   DDD (degenerative disc disease), lumbar 04/22/2017   Uterine cramping 10/02/2016   Chronic anxiety 10/02/2016   Hypertension 10/02/2016    Allergies  Allergen Reactions   Divalproex Sodium Nausea And Vomiting   Valproic Acid Nausea And Vomiting    Past Surgical History:  Procedure Laterality Date   ABLATION  05/2015   KNEE ARTHROSCOPY Left 05/2009   MEDIAL PARTIAL KNEE REPLACEMENT Left 11/2009    Social History  Tobacco Use   Smoking status: Never   Smokeless tobacco: Never  Vaping Use   Vaping status: Never Used  Substance Use Topics   Alcohol use: Yes   Drug use: Never     Medication list has been reviewed and updated.  Current Meds  Medication Sig   Calcium Carbonate (CALTRATE 600 PO) Take 1 tablet by mouth daily.   conjugated estrogens (PREMARIN) vaginal cream Place 1 Applicatorful vaginally 2 (two) times a week.  1 gram vaginally at bedtime twice weekly   Cranberry 1000 MG CAPS Take 1 capsule by mouth daily.   metoprolol succinate (TOPROL-XL) 100 MG 24 hr tablet Take 1 tablet (100 mg total) by mouth daily. Take with or immediately following a meal.   naproxen (NAPROSYN) 500 MG tablet Take 1 tablet by mouth twice daily   omeprazole (PRILOSEC) 40 MG capsule Take 40 mg by mouth 2 (two) times daily. otc   triamcinolone cream (KENALOG) 0.1 % Apply 1 application topically 2 (two) times daily.       10/25/2022    9:11 AM 04/24/2022    9:19 AM 02/08/2022   10:56 AM 12/21/2021    2:41 PM  GAD 7 : Generalized Anxiety Score  Nervous, Anxious, on Edge 0 0 0 0  Control/stop worrying 0 0 0 0  Worry too much - different things 0 0 0 0  Trouble relaxing 0 0 0 0  Restless 0 0 0 0  Easily annoyed or irritable 0 0 0 0  Afraid - awful might happen 0 0 0 0  Total GAD 7 Score 0 0 0 0  Anxiety Difficulty Not difficult at all Not difficult at all Not difficult at all Not difficult at all       10/25/2022    9:11 AM 04/24/2022    9:19 AM 02/08/2022   10:56 AM  Depression screen PHQ 2/9  Decreased Interest 0 0 0  Down, Depressed, Hopeless 0 0 0  PHQ - 2 Score 0 0 0  Altered sleeping 0 0 0  Tired, decreased energy 0 0 0  Change in appetite 0 0 0  Feeling bad or failure about yourself  0 0 0  Trouble concentrating 0 0 0  Moving slowly or fidgety/restless 0 0 0  Suicidal thoughts 0 0 0  PHQ-9 Score 0 0 0  Difficult doing work/chores Not difficult at all Not difficult at all Not difficult at all    BP Readings from Last 3 Encounters:  04/25/23 128/76  10/25/22 122/78  04/24/22 124/76    Physical Exam Vitals and nursing note reviewed.  Constitutional:      General: She is not in acute distress.    Appearance: She is not diaphoretic.  HENT:     Head: Normocephalic and atraumatic.     Right Ear: Tympanic membrane, ear canal and external ear normal.     Left Ear: Tympanic membrane, ear canal and external  ear normal.     Nose: Nose normal.     Mouth/Throat:     Mouth: Mucous membranes are moist.  Eyes:     General:        Right eye: No discharge.        Left eye: No discharge.     Conjunctiva/sclera: Conjunctivae normal.     Pupils: Pupils are equal, round, and reactive to light.  Neck:     Thyroid: No thyromegaly.     Vascular: No JVD.  Cardiovascular:     Rate  and Rhythm: Normal rate and regular rhythm.     Heart sounds: Normal heart sounds. No murmur heard.    No friction rub. No gallop.  Pulmonary:     Effort: Pulmonary effort is normal.     Breath sounds: Normal breath sounds. No wheezing, rhonchi or rales.  Abdominal:     General: Bowel sounds are normal.     Palpations: Abdomen is soft. There is no mass.     Tenderness: There is no abdominal tenderness. There is no guarding.  Musculoskeletal:        General: Normal range of motion.     Cervical back: Normal range of motion and neck supple.  Lymphadenopathy:     Cervical: No cervical adenopathy.  Skin:    General: Skin is warm and dry.  Neurological:     Mental Status: She is alert.     Deep Tendon Reflexes: Reflexes are normal and symmetric.     Wt Readings from Last 3 Encounters:  04/25/23 285 lb 4 oz (129.4 kg)  10/25/22 298 lb (135.2 kg)  04/24/22 286 lb (129.7 kg)    BP 128/76   Pulse 71   Ht 5\' 8"  (1.727 m)   Wt 285 lb 4 oz (129.4 kg)   SpO2 98%   BMI 43.37 kg/m   Assessment and Plan:  1. Essential hypertension (Primary) Chronic.  Controlled.  Stable.  Asymptomatic.  Tolerating medication well.  Blood pressure today is 128/76.  Patient will continue metoprolol XL 100 mg once a day.  We will check renal function panel for electrolytes and GFR.  And will recheck patient in 6 months. - metoprolol succinate (TOPROL-XL) 100 MG 24 hr tablet; Take 1 tablet (100 mg total) by mouth daily. Take with or immediately following a meal.  Dispense: 90 tablet; Refill: 1 - Renal Function Panel  2. Post-traumatic  osteoarthritis of left knee Chronic.  Controlled.  However when discussing may be switching over to Tylenol or reducing the dose patient states that she "barely able to function" on current dosings of naproxen 500 mg twice a day.  She is unable to take the time to have surgery on her knees so we will continue this bone we will check a renal function panel for GFR and creatinine and patient has been instructed if she could maybe use a dose of Tylenol in the place of the naproxen on her good days as well as with consideration of reducing the dose in the future she understands the concerns that we have for long-term naproxen dosing. - naproxen (NAPROSYN) 500 MG tablet; Take 1 tablet (500 mg total) by mouth 2 (two) times daily.  Dispense: 180 tablet; Refill: 1  3. Eczema, unspecified type Chronic.  Controlled.  Stable.  Patient with a history of eczema having a current flare on the dorsums of both hands which may actually be contact dermatitis however she has done well with triamcinolone 0.1% twice a day in the past and we will refill this at current dosing. - triamcinolone cream (KENALOG) 0.1 %; Apply 1 Application topically 2 (two) times daily.  Dispense: 30 g; Refill: 1    Elizabeth Sauer, MD

## 2023-04-26 ENCOUNTER — Encounter: Payer: Self-pay | Admitting: Family Medicine

## 2023-04-26 LAB — RENAL FUNCTION PANEL
Albumin: 4.3 g/dL (ref 3.8–4.9)
BUN/Creatinine Ratio: 23 (ref 9–23)
BUN: 18 mg/dL (ref 6–24)
CO2: 25 mmol/L (ref 20–29)
Calcium: 9.4 mg/dL (ref 8.7–10.2)
Chloride: 104 mmol/L (ref 96–106)
Creatinine, Ser: 0.79 mg/dL (ref 0.57–1.00)
Glucose: 91 mg/dL (ref 70–99)
Phosphorus: 3.7 mg/dL (ref 3.0–4.3)
Potassium: 5.3 mmol/L — ABNORMAL HIGH (ref 3.5–5.2)
Sodium: 141 mmol/L (ref 134–144)
eGFR: 90 mL/min/{1.73_m2} (ref >59)

## 2023-10-02 ENCOUNTER — Other Ambulatory Visit: Payer: Self-pay | Admitting: Medical Genetics

## 2023-10-09 ENCOUNTER — Encounter: Admitting: Student

## 2023-11-12 ENCOUNTER — Encounter: Payer: Self-pay | Admitting: Student

## 2023-11-12 ENCOUNTER — Ambulatory Visit: Admitting: Student

## 2023-11-12 ENCOUNTER — Other Ambulatory Visit: Payer: Self-pay | Admitting: Student

## 2023-11-12 ENCOUNTER — Other Ambulatory Visit: Payer: Self-pay

## 2023-11-12 VITALS — BP 126/76 | HR 80 | Ht 68.0 in | Wt 293.0 lb

## 2023-11-12 DIAGNOSIS — Z96652 Presence of left artificial knee joint: Secondary | ICD-10-CM

## 2023-11-12 DIAGNOSIS — E875 Hyperkalemia: Secondary | ICD-10-CM

## 2023-11-12 DIAGNOSIS — M1732 Unilateral post-traumatic osteoarthritis, left knee: Secondary | ICD-10-CM

## 2023-11-12 DIAGNOSIS — E669 Obesity, unspecified: Secondary | ICD-10-CM | POA: Insufficient documentation

## 2023-11-12 DIAGNOSIS — Z23 Encounter for immunization: Secondary | ICD-10-CM

## 2023-11-12 DIAGNOSIS — I10 Essential (primary) hypertension: Secondary | ICD-10-CM | POA: Diagnosis not present

## 2023-11-12 DIAGNOSIS — E66813 Obesity, class 3: Secondary | ICD-10-CM

## 2023-11-12 DIAGNOSIS — I872 Venous insufficiency (chronic) (peripheral): Secondary | ICD-10-CM

## 2023-11-12 DIAGNOSIS — E876 Hypokalemia: Secondary | ICD-10-CM

## 2023-11-12 DIAGNOSIS — Z6841 Body Mass Index (BMI) 40.0 and over, adult: Secondary | ICD-10-CM

## 2023-11-12 DIAGNOSIS — K219 Gastro-esophageal reflux disease without esophagitis: Secondary | ICD-10-CM

## 2023-11-12 MED ORDER — NAPROXEN 500 MG PO TABS: 500.0000 mg | ORAL_TABLET | Freq: Two times a day (BID) | ORAL | 1 refills | Status: AC

## 2023-11-12 NOTE — Assessment & Plan Note (Signed)
 Is taking omeprazole 40 mg daily and well controlled.

## 2023-11-12 NOTE — Assessment & Plan Note (Addendum)
 Weight is 293 lbs, BMI 44 today. Has been doing intermittent fasting and eating higher protein and low carb diet. Eating 18 to 19 thousand calories a day. Walks treadmill 30 minutes a day and 30 minutes of weight training at least 5 days week. Feels her clothes are looser. Continue diet and exercise. Follow up in 1 month. TSH and A1c today.

## 2023-11-12 NOTE — Assessment & Plan Note (Addendum)
 Normotensive today. Was on metoprolol  100 mg daily but stopped about 2 months ago. Home pressures typically 140/80 on wrist cuff. Stop BB given she is normotensive today. Keep BP log and bring cuff to next appointment.

## 2023-11-12 NOTE — Progress Notes (Signed)
 Established Patient Office Visit  Subjective   Patient ID: Laura Christian, female    DOB: June 28, 1970  Age: 53 y.o. MRN: [id number]  Chief Complaint  Patient presents with   Hypertension    Jacklyn Phillipson with medical hx listed below presents today for transfer of care. Please refer to problem based charting for further details and assessment and plan of current problem and chronic medical conditions.  Patient Active Problem List   Diagnosis Date Noted   Hyperkalemia 11/17/2023   Chronic venous insufficiency of lower extremity 11/17/2023   GERD (gastroesophageal reflux disease) 11/12/2023   Obesity 11/12/2023   Status post left partial knee replacement 07/12/2020   Post-traumatic osteoarthritis of left knee 07/12/2020   DDD (degenerative disc disease), lumbar 04/22/2017   Chronic anxiety 10/02/2016   Hypertension 10/02/2016    ROS Refer to HPI    Objective:     Outpatient Encounter Medications as of 11/12/2023  Medication Sig   Calcium Carbonate (CALTRATE 600 PO) Take 1 tablet by mouth daily.   conjugated estrogens  (PREMARIN ) vaginal cream Place 1 Applicatorful vaginally 2 (two) times a week. 1 gram vaginally at bedtime twice weekly   mupirocin ointment (BACTROBAN) 2 % Apply 1 Application topically as needed.   omeprazole (PRILOSEC) 40 MG capsule Take 40 mg by mouth 2 (two) times daily. otc   triamcinolone  cream (KENALOG ) 0.1 % Apply 1 Application topically 2 (two) times daily.   [DISCONTINUED] Azelastine HCl 137 MCG/SPRAY SOLN Place 2 sprays into both nostrils 2 (two) times daily. (Patient taking differently: Place 2 sprays into both nostrils as needed.)   [DISCONTINUED] Cranberry 1000 MG CAPS Take 1 capsule by mouth daily.   [DISCONTINUED] metoprolol  succinate (TOPROL -XL) 100 MG 24 hr tablet Take 1 tablet (100 mg total) by mouth daily. Take with or immediately following a meal.   [DISCONTINUED] naproxen  (NAPROSYN ) 500 MG tablet Take 1 tablet (500 mg total) by mouth  2 (two) times daily.   No facility-administered encounter medications on file as of 11/12/2023.    BP 126/76   Pulse 80   Ht 5' 8 (1.727 m)   Wt 293 lb (132.9 kg)   SpO2 99%   BMI 44.55 kg/m  BP Readings from Last 3 Encounters:  11/12/23 126/76  04/25/23 128/76  10/25/22 122/78    Physical Exam Constitutional:      Appearance: Normal appearance.  HENT:     Mouth/Throat:     Mouth: Mucous membranes are moist.     Pharynx: Oropharynx is clear.  Cardiovascular:     Rate and Rhythm: Normal rate and regular rhythm.     Pulses: Normal pulses.     Heart sounds: No murmur heard.    Comments: DP palpable and equal bilaterally  Pulmonary:     Effort: Pulmonary effort is normal.     Breath sounds: No rhonchi or rales.  Abdominal:     General: Abdomen is flat. Bowel sounds are normal. There is no distension.     Palpations: Abdomen is soft.     Tenderness: There is no abdominal tenderness.  Musculoskeletal:        General: Normal range of motion.     Right lower leg: Edema present.     Left lower leg: Edema present.     Comments: LE up to shin bilaterally. Edema in LLE>RLE, area of desquamation of the LLE above ankle, no significant erythema, drainage, or warmth   Skin:    General: Skin is warm and dry.  Capillary Refill: Capillary refill takes less than 2 seconds.     Comments: Skin thickening around ankles from chronic edema  Neurological:     General: No focal deficit present.     Mental Status: She is alert and oriented to person, place, and time.  Psychiatric:        Mood and Affect: Mood normal.        Behavior: Behavior normal.        11/12/2023    2:55 PM 04/25/2023    9:18 AM 10/25/2022    9:11 AM  Depression screen PHQ 2/9  Decreased Interest 0 0 0  Down, Depressed, Hopeless 0 0 0  PHQ - 2 Score 0 0 0  Altered sleeping 1 2 0  Tired, decreased energy 1 1 0  Change in appetite 0 0 0  Feeling bad or failure about yourself  0 0 0  Trouble concentrating  0 0 0  Moving slowly or fidgety/restless 0 0 0  Suicidal thoughts 0 0 0  PHQ-9 Score 2 3 0  Difficult doing work/chores Not difficult at all Not difficult at all Not difficult at all       11/12/2023    2:55 PM 04/25/2023    9:19 AM 10/25/2022    9:11 AM 04/24/2022    9:19 AM  GAD 7 : Generalized Anxiety Score  Nervous, Anxious, on Edge 1 0 0 0  Control/stop worrying 1 0 0 0  Worry too much - different things 1 0 0 0  Trouble relaxing 1 0 0 0  Restless 0 0 0 0  Easily annoyed or irritable 0 0 0 0  Afraid - awful might happen 0 0 0 0  Total GAD 7 Score 4 0 0 0  Anxiety Difficulty Not difficult at all Not difficult at all Not difficult at all Not difficult at all    Results for orders placed or performed in visit on 11/12/23  Comprehensive Metabolic Panel (CMET)  Result Value Ref Range   Glucose 91 70 - 99 mg/dL   BUN 19 6 - 24 mg/dL   Creatinine, Ser 9.33 0.57 - 1.00 mg/dL   eGFR 894 >40 fO/fpw/8.26   BUN/Creatinine Ratio 29 (H) 9 - 23   Sodium 139 134 - 144 mmol/L   Potassium 4.6 3.5 - 5.2 mmol/L   Chloride 104 96 - 106 mmol/L   CO2 22 20 - 29 mmol/L   Calcium 9.2 8.7 - 10.2 mg/dL   Total Protein 7.0 6.0 - 8.5 g/dL   Albumin 4.3 3.8 - 4.9 g/dL   Globulin, Total 2.7 1.5 - 4.5 g/dL   Bilirubin Total 0.4 0.0 - 1.2 mg/dL   Alkaline Phosphatase 107 49 - 135 IU/L   AST 25 0 - 40 IU/L   ALT 18 0 - 32 IU/L  TSH  Result Value Ref Range   TSH 2.890 0.450 - 4.500 uIU/mL  HgB A1c  Result Value Ref Range   Hgb A1c MFr Bld 5.3 4.8 - 5.6 %   Est. average glucose Bld gHb Est-mCnc 105 mg/dL    Last CBC Lab Results  Component Value Date   WBC 4.3 10/31/2018   HGB 12.1 10/31/2018   HCT 38.5 10/31/2018   MCV 87 10/31/2018   MCH 27.2 10/31/2018   RDW 13.1 10/31/2018   PLT 258 10/31/2018   Last metabolic panel Lab Results  Component Value Date   GLUCOSE 91 11/12/2023   NA 139 11/12/2023   K 4.6 11/12/2023  CL 104 11/12/2023   CO2 22 11/12/2023   BUN 19 11/12/2023    CREATININE 0.66 11/12/2023   EGFR 105 11/12/2023   CALCIUM 9.2 11/12/2023   PHOS 3.7 04/25/2023   PROT 7.0 11/12/2023   ALBUMIN 4.3 11/12/2023   LABGLOB 2.7 11/12/2023   AGRATIO 1.7 10/23/2021   BILITOT 0.4 11/12/2023   ALKPHOS 107 11/12/2023   AST 25 11/12/2023   ALT 18 11/12/2023   ANIONGAP 8 04/01/2017   Last lipids Lab Results  Component Value Date   CHOL 195 10/23/2021   HDL 44 10/23/2021   LDLCALC 127 (H) 10/23/2021   TRIG 131 10/23/2021   CHOLHDL 4.3 05/13/2018   Last hemoglobin A1c Lab Results  Component Value Date   HGBA1C 5.3 11/12/2023      The 10-year ASCVD risk score (Arnett DK, et al., 2019) is: 2%    Assessment & Plan:  Encounter for immunization -     Flu vaccine trivalent PF, 6mos and older(Flulaval,Afluria,Fluarix,Fluzone)  Primary hypertension Assessment & Plan: Normotensive today. Was on metoprolol  100 mg daily but stopped about 2 months ago. Home pressures typically 140/80 on wrist cuff. Stop BB given she is normotensive today. Keep BP log and bring cuff to next appointment.    Gastroesophageal reflux disease, unspecified whether esophagitis present Assessment & Plan: Is taking omeprazole 40 mg daily and well controlled.    Class 3 severe obesity due to excess calories with serious comorbidity and body mass index (BMI) of 40.0 to 44.9 in adult [facility]) Assessment & Plan: Weight is 293 lbs, BMI 44 today. Has been doing intermittent fasting and eating higher protein and low carb diet. Eating 18 to 19 thousand calories a day. Walks treadmill 30 minutes a day and 30 minutes of weight training at least 5 days week. Feels her clothes are looser. Continue diet and exercise. Follow up in 1 month. TSH and A1c today.   Orders: -     TSH -     Hemoglobin A1c  Status post left partial knee replacement Assessment & Plan: Reports she cannot afford genicular nerve ablation or knee  replacement. Discussed options including radiation therapy. Continue  naproxen  twice daily.    Hyperkalemia Assessment & Plan: Elevated previously elevated to 5.3 on 04/25/23 metabolic panel today.  Orders: -     Comprehensive metabolic panel with GFR  Chronic venous insufficiency of lower extremity Assessment & Plan: Needs new compression stockings, gets this a [employer] . She will arrange for this. Reports extensive workup a vascular follow up without significant improvement. Declines medication or further workup.       Return in about 4 weeks (around 12/10/2023) for weight, htn.    Harlene Saddler, MD

## 2023-11-13 ENCOUNTER — Ambulatory Visit: Payer: Self-pay | Admitting: Student

## 2023-11-13 LAB — COMPREHENSIVE METABOLIC PANEL WITH GFR
ALT: 18 IU/L (ref 0–32)
AST: 25 IU/L (ref 0–40)
Albumin: 4.3 g/dL (ref 3.8–4.9)
Alkaline Phosphatase: 107 IU/L (ref 49–135)
BUN/Creatinine Ratio: 29 — ABNORMAL HIGH (ref 9–23)
BUN: 19 mg/dL (ref 6–24)
Bilirubin Total: 0.4 mg/dL (ref 0.0–1.2)
CO2: 22 mmol/L (ref 20–29)
Calcium: 9.2 mg/dL (ref 8.7–10.2)
Chloride: 104 mmol/L (ref 96–106)
Creatinine, Ser: 0.66 mg/dL (ref 0.57–1.00)
Globulin, Total: 2.7 g/dL (ref 1.5–4.5)
Glucose: 91 mg/dL (ref 70–99)
Potassium: 4.6 mmol/L (ref 3.5–5.2)
Sodium: 139 mmol/L (ref 134–144)
Total Protein: 7 g/dL (ref 6.0–8.5)
eGFR: 105 mL/min/1.73 (ref >59)

## 2023-11-13 LAB — HEMOGLOBIN A1C
Est. average glucose Bld gHb Est-mCnc: 105 mg/dL
Hgb A1c MFr Bld: 5.3 % (ref 4.8–5.6)

## 2023-11-13 LAB — TSH: TSH: 2.89 u[IU]/mL (ref 0.450–4.500)

## 2023-11-17 DIAGNOSIS — I872 Venous insufficiency (chronic) (peripheral): Secondary | ICD-10-CM | POA: Insufficient documentation

## 2023-11-17 DIAGNOSIS — E875 Hyperkalemia: Secondary | ICD-10-CM | POA: Insufficient documentation

## 2023-11-17 NOTE — Assessment & Plan Note (Signed)
 Needs new compression stockings, gets this a [employer] . She will arrange for this. Reports extensive workup a vascular follow up without significant improvement. Declines medication or further workup.

## 2023-11-17 NOTE — Assessment & Plan Note (Signed)
 Elevated previously elevated to 5.3 on 04/25/23 metabolic panel today.

## 2023-11-17 NOTE — Assessment & Plan Note (Signed)
 Reports she cannot afford genicular nerve ablation or knee  replacement. Discussed options including radiation therapy. Continue naproxen  twice daily.

## 2023-12-10 ENCOUNTER — Ambulatory Visit: Admitting: Student
